# Patient Record
Sex: Female | Born: 1975 | Race: White | Hispanic: No | Marital: Married | Smoking: Never smoker
Health system: Southern US, Community
[De-identification: ages and names within clinical notes are randomized; demographics above are authoritative.]

## PROBLEM LIST (undated history)

## (undated) DIAGNOSIS — D219 Benign neoplasm of connective and other soft tissue, unspecified: Secondary | ICD-10-CM

## (undated) DIAGNOSIS — F32A Depression, unspecified: Secondary | ICD-10-CM

## (undated) DIAGNOSIS — F419 Anxiety disorder, unspecified: Secondary | ICD-10-CM

## (undated) HISTORY — DX: Benign neoplasm of connective and other soft tissue, unspecified: D21.9

## (undated) HISTORY — PX: FOOT SURGERY: SHX648

---

## 2009-12-25 ENCOUNTER — Other Ambulatory Visit
Admission: RE | Admit: 2009-12-25 | Discharge: 2009-12-25 | Payer: Self-pay | Source: Home / Self Care | Admitting: Family Medicine

## 2013-01-29 ENCOUNTER — Other Ambulatory Visit: Payer: Self-pay

## 2013-01-29 ENCOUNTER — Other Ambulatory Visit (HOSPITAL_COMMUNITY)
Admission: RE | Admit: 2013-01-29 | Discharge: 2013-01-29 | Disposition: A | Discharge status: Home / Self Care | Payer: 59 | Source: Ambulatory Visit | Attending: Family Medicine | Admitting: Family Medicine

## 2013-01-29 DIAGNOSIS — Z01419 Encounter for gynecological examination (general) (routine) without abnormal findings: Secondary | ICD-10-CM | POA: Insufficient documentation

## 2014-12-18 ENCOUNTER — Ambulatory Visit (INDEPENDENT_AMBULATORY_CARE_PROVIDER_SITE_OTHER): Payer: 59 | Admitting: Sports Medicine

## 2014-12-18 ENCOUNTER — Encounter: Payer: Self-pay | Admitting: Sports Medicine

## 2014-12-18 VITALS — BP 130/82 | Ht 63.0 in | Wt 160.0 lb

## 2014-12-18 DIAGNOSIS — M25579 Pain in unspecified ankle and joints of unspecified foot: Secondary | ICD-10-CM

## 2014-12-18 NOTE — Progress Notes (Signed)
   Subjective:    Patient ID: Mckenzie Maldonado, female    DOB: 1975-07-17, 39 y.o.   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 Pain is primarily along the medial aspect of her ankle and into her arch. She's also desribing some pain along lateral aspect of her right foot. She has had custom orthotics made by a podiatrist in the past which were somewhat comfortable. She has most recently been working with a Curator and he has been doing some soft tissue release and at, his recommendation, she discontinued her orthotics altogether. She describes a generalized aching discomfort that involves the majority of the plantar aspect of her foot with running. This is in addition to the medial sided ankle pain which she has had for years. Despite her pain she has been able to complete 9 half marathons. No trauma. She denies swelling. No numbness or tingling. She'll take an occasional over-the-counter NSAID as needed for pain. No prior surgery to either foot or ankle in the past.  Past medical history reviewed Medications reviewed Allergies reviewed    Review of Systems    as above Objective:   Physical Exam Well-developed, well-nourished. No acute distress. Awake alert and oriented 3. Vital signs reviewed  Examination of each foot shows mild pes planus with standing. She has collapse of the transverse arches bilaterally. No significant callus buildup. Slight tenderness to palpation along the posterior tibialis tendon bilaterally but not marked. No tenderness to palpation or percussion along the medial malleolus. Normal calcaneal inversion with standing on her tiptoes. Neurovascularly intact distally.  Bilateral hips: Tight IT bands bilaterally.  Examination of her running gait shows out toeing bilaterally with mild pes planus. She runs without a limp.       Assessment & Plan:    Chronic bilateral posterior tibialis tendinopathy Pes planus Transverse arch collapse  I will start with giving the patient some IT band stretches, piriformis stretches, and hip adductor stretches. She is also going to start a modified Alfredson heel drop exercise with her toe pointed inwards to isolate the posterior tibialis tendon. I think she definitely needs an orthotic but the orthotics made by the podiatrist are very rigid. I would like to start with a temporary green sports insole to which we will add scaphoid pads and metatarsal pads. She'll follow-up in the office in 6-8 weeks for reevaluation. Depending on how she is doing we may consider new custom orthotics at that time. She is okay to continue with all activity including running using pain as her guide.

## 2015-02-06 ENCOUNTER — Encounter: Payer: Self-pay | Admitting: Sports Medicine

## 2015-02-06 ENCOUNTER — Ambulatory Visit (INDEPENDENT_AMBULATORY_CARE_PROVIDER_SITE_OTHER): Payer: 59 | Admitting: Sports Medicine

## 2015-02-06 VITALS — BP 114/76 | Ht 63.0 in | Wt 155.0 lb

## 2015-02-06 DIAGNOSIS — M629 Disorder of muscle, unspecified: Secondary | ICD-10-CM

## 2015-02-06 DIAGNOSIS — M76829 Posterior tibial tendinitis, unspecified leg: Secondary | ICD-10-CM

## 2015-02-06 NOTE — Progress Notes (Signed)
   Subjective:    Patient ID: Mckenzie Maldonado, female    DOB: [birth year], 40 y.o.   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 She tried green inserts with scaphoid padding but felt that the arch support was too much. Her pain is slightly improved despite not really doing exercises at this time. She has done a little bit of IT band stretches as well as posterior tibialis eccentrics.  Past medical history reviewed Medications reviewed Allergies reviewed    Review of Systems    as above Objective:   Physical Exam Well-developed, well-nourished. No acute distress. Awake alert and oriented 3. Vital signs reviewed  Examination of each foot shows mild pes planus with standing. She has collapse of the transverse arches bilaterally. No significant callus buildup. Slight tenderness to palpation along the posterior tibialis tendon bilaterally but not marked. No tenderness to palpation or percussion along the medial malleolus. Normal calcaneal inversion with standing on her tiptoes. Neurovascularly intact distally.  Examination of her running gait shows out toeing bilaterally with mild pes planus. She runs without a limp.       Assessment & Plan:  Chronic bilateral posterior tibialis tendinopathy Pes planus Transverse arch collapse  Continue with stretches and PT exercises Orthotics made today F/U in 4 weeks to re-evaluate   Patient was fitted for a standard, cushioned, semi-rigid orthotic. The orthotic was heated and afterward the patient stood on the orthotic blank positioned on the orthotic stand. The patient was positioned in subtalar neutral position and 10 degrees of ankle dorsiflexion in a weight bearing stance. After completion of molding, a stable base was applied to the orthotic blank. The blank was ground to a stable position for weight bearing. Size:  9 Base: Blue EVA Additional Posting and Padding: None The patient ambulated these, and they were very comfortable.  I spent 40 minutes with this patient, greater than 50% was face-to-face time counseling regarding the below diagnosis.

## 2016-07-20 ENCOUNTER — Other Ambulatory Visit: Payer: Self-pay | Admitting: Family Medicine

## 2016-07-20 ENCOUNTER — Other Ambulatory Visit (HOSPITAL_COMMUNITY)
Admission: RE | Admit: 2016-07-20 | Discharge: 2016-07-20 | Disposition: A | Discharge status: Home / Self Care | Payer: 59 | Source: Ambulatory Visit | Attending: Family Medicine | Admitting: Family Medicine

## 2016-07-20 DIAGNOSIS — Z01411 Encounter for gynecological examination (general) (routine) with abnormal findings: Secondary | ICD-10-CM | POA: Diagnosis present

## 2016-07-20 DIAGNOSIS — Z1151 Encounter for screening for human papillomavirus (HPV): Secondary | ICD-10-CM | POA: Insufficient documentation

## 2016-07-22 LAB — CYTOLOGY - PAP
ADEQUACY: ABSENT
Diagnosis: NEGATIVE
HPV: NOT DETECTED

## 2016-12-16 ENCOUNTER — Ambulatory Visit: Payer: 59 | Admitting: Family Medicine

## 2016-12-16 ENCOUNTER — Encounter: Payer: Self-pay | Admitting: Family Medicine

## 2016-12-16 DIAGNOSIS — M25561 Pain in right knee: Secondary | ICD-10-CM

## 2016-12-16 NOTE — Patient Instructions (Signed)
Your exam is reassuring. This is consistent with distal IT band syndrome (your meniscus testing is negative, testing for stress fracture negative, and there's no evidence of a plica laterally). I would run on level ground or treadmill at most 10 minutes at a time every other day for at least 2 weeks, up to 4 weeks - stop if pain is worse than a 3 on a scale of 1-10 or you're limping. Cross train on off days with cycling, swimming, or (if not painful) elliptical. Focus on IT band stretches and hip, knee strengthening. 3 sets of 10 of hip abduction, standing hip rotation, knee extensions, and hamstring curls. Add ankle weight if these become too easy. Icing 15 minutes at a time after exercise. If at any point the pain is severe and it's during office hours, swing by and I'll quickly reexamine you (no charge visit). Follow up with me as needed otherwise if you're doing well.

## 2016-12-20 ENCOUNTER — Encounter: Payer: Self-pay | Admitting: Family Medicine

## 2016-12-20 DIAGNOSIS — M25561 Pain in right knee: Secondary | ICD-10-CM | POA: Insufficient documentation

## 2016-12-20 NOTE — Progress Notes (Signed)
PCP: London Pepper, MD  Subjective:   HPI: Patient is a 41 y.o. female here for right knee pain.  Patient reports she started to get pain lateral right knee on 10/15 while training for a marathon (ran the marathon on 11/4). Pain is 1/10 level but gets up to 8/10 at times with running, sharp. Pain started about 1 mile into her training run and nothing unusual about that run. She saw chiropractor and started doing short runs, relative rest leading up to marathon - only able to run 3 miles before pain worsened. She rested 2 weeks after this then in a short run pain started right away. Has been icing, not taking any medicines. Cross training. Able to do walk:jog this week ok. Hip feels tight and achy since this. No prior knee issues. No skin changes, numbness.  History reviewed. No pertinent past medical history.  Current Outpatient Medications on File Prior to Visit  Medication Sig Dispense Refill  . ibuprofen (ADVIL,MOTRIN) 200 MG tablet Take 200 mg by mouth every 6 (six) hours as needed.    [name] FE 1/20 1-20 MG-MCG tablet Take 1 tablet by mouth daily.  3   No current facility-administered medications on file prior to visit.     History reviewed. No pertinent surgical history.  No Known Allergies  Social History   Socioeconomic History  . Marital status: Married    Spouse name: Not on file  . Number of children: Not on file  . Years of education: Not on file  . Highest education level: Not on file  Social Needs  . Financial resource strain: Not on file  . Food insecurity - worry: Not on file  . Food insecurity - inability: Not on file  . Transportation needs - medical: Not on file  . Transportation needs - non-medical: Not on file  Occupational History  . Not on file  Tobacco Use  . Smoking status: Never Smoker  . Smokeless tobacco: Never Used  Substance and Sexual Activity  . Alcohol use: Not on file  . Drug use: Not on file  . Sexual activity: Not on file   Other Topics Concern  . Not on file  Social History Narrative  . Not on file    History reviewed. No pertinent family history.  BP 134/87   Pulse 66   Ht 5\' 3"  (1.6 m)   Wt 135 lb (61.2 kg)   BMI 23.91 kg/m   Review of Systems: See HPI above.     Objective:  Physical Exam:  Gen: NAD, comfortable in exam room  Right knee: No gross deformity, ecchymoses, swelling.  No patellar maltracking. No TTP. FROM with 5/5 strength without pain including hip abduction. Negative ant/post drawers. Negative valgus/varus testing. Negative lachmanns. Negative mcmurrays, apleys, patellar apprehension. Negative hop test. NV intact distally.  Left knee: No gross deformity, ecchymoses, swelling. No TTP. FROM with 5/5 strength. Negative ant/post drawers. Negative valgus/varus testing. Negative lachmanns. NV intact distally.   Patient ran on treadmill for 20 minutes, couldn't reproduce pain during visit.  Assessment & Plan:  1. Right knee pain - exam reassuring and unable to reproduce today after running on treadmill.  Consistent with distal IT band syndrome.  Shown home exercises and stretches to do daily.  Discussed walk:jog program on level ground with cross training on off days.  Icing if needed, tylenol or ibuprofen if needed.  Advised to follow up if pain becomes severe during day for reevaluation. F/u prn otherwise.

## 2016-12-20 NOTE — Assessment & Plan Note (Signed)
exam reassuring and unable to reproduce today after running on treadmill.  Consistent with distal IT band syndrome.  Shown home exercises and stretches to do daily.  Discussed walk:jog program on level ground with cross training on off days.  Icing if needed, tylenol or ibuprofen if needed.  Advised to follow up if pain becomes severe during day for reevaluation. F/u prn otherwise.

## 2018-02-09 ENCOUNTER — Encounter: Payer: Self-pay | Admitting: Obstetrics and Gynecology

## 2018-02-20 ENCOUNTER — Other Ambulatory Visit: Payer: Self-pay

## 2018-02-20 ENCOUNTER — Encounter: Payer: Self-pay | Admitting: Obstetrics and Gynecology

## 2018-02-20 ENCOUNTER — Ambulatory Visit (INDEPENDENT_AMBULATORY_CARE_PROVIDER_SITE_OTHER): Payer: BLUE CROSS/BLUE SHIELD | Admitting: Obstetrics and Gynecology

## 2018-02-20 VITALS — BP 128/80 | HR 66 | Resp 14 | Ht 62.0 in | Wt 152.4 lb

## 2018-02-20 DIAGNOSIS — R350 Frequency of micturition: Secondary | ICD-10-CM | POA: Diagnosis not present

## 2018-02-20 DIAGNOSIS — D259 Leiomyoma of uterus, unspecified: Secondary | ICD-10-CM

## 2018-02-20 DIAGNOSIS — R3915 Urgency of urination: Secondary | ICD-10-CM

## 2018-02-20 DIAGNOSIS — R109 Unspecified abdominal pain: Secondary | ICD-10-CM

## 2018-02-20 DIAGNOSIS — Z8049 Family history of malignant neoplasm of other genital organs: Secondary | ICD-10-CM

## 2018-02-20 DIAGNOSIS — Z01419 Encounter for gynecological examination (general) (routine) without abnormal findings: Secondary | ICD-10-CM | POA: Diagnosis not present

## 2018-02-20 DIAGNOSIS — N939 Abnormal uterine and vaginal bleeding, unspecified: Secondary | ICD-10-CM

## 2018-02-20 DIAGNOSIS — Z124 Encounter for screening for malignant neoplasm of cervix: Secondary | ICD-10-CM

## 2018-02-20 DIAGNOSIS — N921 Excessive and frequent menstruation with irregular cycle: Secondary | ICD-10-CM

## 2018-02-20 DIAGNOSIS — R102 Pelvic and perineal pain: Secondary | ICD-10-CM

## 2018-02-20 DIAGNOSIS — N882 Stricture and stenosis of cervix uteri: Secondary | ICD-10-CM

## 2018-02-20 LAB — POCT URINALYSIS DIPSTICK
Bilirubin, UA: NEGATIVE
Glucose, UA: NEGATIVE
Ketones, UA: NEGATIVE
LEUKOCYTES UA: NEGATIVE
NITRITE UA: NEGATIVE
Protein, UA: NEGATIVE
RBC UA: NEGATIVE
Urobilinogen, UA: 0.2 E.U./dL
pH, UA: 5 (ref 5.0–8.0)

## 2018-02-20 NOTE — Progress Notes (Signed)
43 y.o. G0P0000 Married Other or two or more races Not Hispanic or Latino female here for new patient exam.  She is on OCP's. She is having "weird cramping" throughout the month, no associated bleeding. The cramping started in the last year and has been more frequent in the last 6 months.  The pain can be anywhere in her lower abdomen, can be a pressure pain, an ache or a wave of pain. Different than her menstrual cramps. The pain lasts for a couple of minutes can come and go over a few hours. The pain can wake her up at night. The pain is a 1-2/10 in severity. The pain is occurring ~1od.  Normal BM qd. No change in the pain with what she eats.  Recently her cycles can linger on for a total of 2 weeks. One to two episodes of heavier than normal bleeding with her cycle. She is taking her pills at the same time daily. The longer cycles is occurring ~q3 months in the last year.  She has a h/o fibroids. Prior to OCP's her cycles were very heavy.  Period Cycle (Days): 28 Period Duration (Days): 5-7  Period Pattern: Regular Menstrual Flow: Moderate, Light Menstrual Control: Tampon Menstrual Control Change Freq (Hours): changes tampon every 6 hours Dysmenorrhea: (!) Mild Dysmenorrhea Symptoms: Cramping  Sexually active, just occasional dyspareunia, positional.   She has long term urinary frequency, worse in the last year. Urgency is new in the last year. No leakage.  She drinks 2 large cups of coffee a day, 2-3 soda's or tea a day as well.   Patient's last menstrual period was 01/27/2018.          Sexually active: Yes.    The current method of family planning is OCP (estrogen/progesterone).    Exercising: Yes.    cardio, strength training Smoker:  no  Health Maintenance: Pap:  07-20-16 negative, HR HPV negative           01-29-13 negative  History of abnormal Pap:  no MMG:  never BMD:   never Colonoscopy: never TDaP:  UTD per patient Gardasil: n/a   reports that she has never smoked. She has  never used smokeless tobacco. She reports current alcohol use. She reports that she does not use drugs. ETOH 1-2 a week. She works in Engineer, technical sales.   Past Medical History:  Diagnosis Date  . Fibroid     History reviewed. No pertinent surgical history.  Current Outpatient Medications  Medication Sig Dispense Refill  . BIOTIN W/ VITAMINS C & E PO Take by mouth.    Marland Kitchen ibuprofen (ADVIL,MOTRIN) 200 MG tablet Take 200 mg by mouth every 6 (six) hours as needed.    [name] FE 1/20 1-20 MG-MCG tablet Take 1 tablet by mouth daily.  3   No current facility-administered medications for this visit.     Family History  Problem Relation Age of Onset  . Uterine cancer Mother   Mom was 69 and over weight.   Review of Systems  Constitutional: Negative.   HENT: Negative.   Eyes: Negative.   Respiratory: Negative.   Cardiovascular: Negative.   Gastrointestinal: Negative.   Endocrine: Negative.   Genitourinary: Positive for frequency and urgency.       Dysmenorrhea   Musculoskeletal: Negative.   Skin: Negative.   Allergic/Immunologic: Negative.   Neurological: Negative.   Hematological: Negative.   Psychiatric/Behavioral: Negative.     Exam:   BP 128/80 (BP Location: Right Arm, Patient Position: Sitting,  Cuff Size: Normal)   Pulse 66   Resp 14   Ht 5\' 2"  (1.575 m)   Wt 152 lb 6.4 oz (69.1 kg)   LMP 01/27/2018   BMI 27.87 kg/m   Weight change: @WEIGHTCHANGE @ Height:   Height: 5\' 2"  (157.5 cm)  Ht Readings from Last 3 Encounters:  02/20/18 5\' 2"  (1.575 m)  12/16/16 5\' 3"  (1.6 m)  02/06/15 5\' 3"  (1.6 m)    General appearance: alert, cooperative and appears stated age Head: Normocephalic, without obvious abnormality, atraumatic Neck: no adenopathy, supple, symmetrical, trachea midline and thyroid normal to inspection and palpation Lungs: clear to auscultation bilaterally Cardiovascular: regular rate and rhythm Breasts: normal appearance, no masses or tenderness Abdomen: soft, non-tender;  non distended,  no masses,  no organomegaly Extremities: extremities normal, atraumatic, no cyanosis or edema Skin: Skin color, texture, turgor normal. No rashes or lesions Lymph nodes: Cervical, supraclavicular, and axillary nodes normal. No abnormal inguinal nodes palpated Neurologic: Grossly normal   Pelvic: External genitalia:  no lesions              Urethra:  normal appearing urethra with no masses, tenderness or lesions              Bartholins and Skenes: normal                 Vagina: normal appearing vagina with normal color and discharge, no lesions              Cervix: no lesions and stenotic               Bimanual Exam:  Uterus:  uterus is retroverted, slightly irregular, slightly enlarged, mobile, not tender              Adnexa: no mass, fullness, tenderness               Rectovaginal: Confirms               Anus:  normal sphincter tone, no lesions  Chaperone was present for exam.  A:  Well Woman with normal exam  Abnormal bleeding on OCP's  Fibroid uterus  Abdominal/pelvic pain  OAB symptoms, drinks large amounts of caffeine   P:   Pap with hpv  Mammogram, # given  Return for gyn Ultrasound, possible endometrial biopsy  TSH today  Urine for ua, c&s  Cut back on caffeine   Other labs with primary  Discussed breast self exam  Discussed calcium and vit D intake  Depending on U/S results will make further plans on OCP's

## 2018-02-20 NOTE — Patient Instructions (Signed)
EXERCISE AND DIET:  We recommended that you start or continue a regular exercise program for good health. Regular exercise means any activity that makes your heart beat faster and makes you sweat.  We recommend exercising at least 30 minutes per day at least 3 days a week, preferably 4 or 5.  We also recommend a diet low in fat and sugar.  Inactivity, poor dietary choices and obesity can cause diabetes, heart attack, stroke, and kidney damage, among others.    ALCOHOL AND SMOKING:  Women should limit their alcohol intake to no more than 7 drinks/beers/glasses of wine (combined, not each!) per week. Moderation of alcohol intake to this level decreases your risk of breast cancer and liver damage. And of course, no recreational drugs are part of a healthy lifestyle.  And absolutely no smoking or even second hand smoke. Most people know smoking can cause heart and lung diseases, but did you know it also contributes to weakening of your bones? Aging of your skin?  Yellowing of your teeth and nails?  CALCIUM AND VITAMIN D:  Adequate intake of calcium and Vitamin D are recommended.  The recommendations for exact amounts of these supplements seem to change often, but generally speaking 1,000 mg of calcium (between diet and supplement) and 800 units of Vitamin D per day seems prudent. Certain women may benefit from higher intake of Vitamin D.  If you are among these women, your doctor will have told you during your visit.    PAP SMEARS:  Pap smears, to check for cervical cancer or precancers,  have traditionally been done yearly, although recent scientific advances have shown that most women can have pap smears less often.  However, every woman still should have a physical exam from her gynecologist every year. It will include a breast check, inspection of the vulva and vagina to check for abnormal growths or skin changes, a visual exam of the cervix, and then an exam to evaluate the size and shape of the uterus and  ovaries.  And after 40 years of age, a rectal exam is indicated to check for rectal cancers. We will also provide age appropriate advice regarding health maintenance, like when you should have certain vaccines, screening for sexually transmitted diseases, bone density testing, colonoscopy, mammograms, etc.   MAMMOGRAMS:  All women over 40 years old should have a yearly mammogram. Many facilities now offer a "3D" mammogram, which may cost around $50 extra out of pocket. If possible,  we recommend you accept the option to have the 3D mammogram performed.  It both reduces the number of women who will be called back for extra views which then turn out to be normal, and it is better than the routine mammogram at detecting truly abnormal areas.    COLON CANCER SCREENING: Now recommend starting at age 45. At this time colonoscopy is not covered for routine screening until 50. There are take home tests that can be done between 45-49.   COLONOSCOPY:  Colonoscopy to screen for colon cancer is recommended for all women at age 50.  We know, you hate the idea of the prep.  We agree, BUT, having colon cancer and not knowing it is worse!!  Colon cancer so often starts as a polyp that can be seen and removed at colonscopy, which can quite literally save your life!  And if your first colonoscopy is normal and you have no family history of colon cancer, most women don't have to have it again for   10 years.  Once every ten years, you can do something that may end up saving your life, right?  We will be happy to help you get it scheduled when you are ready.  Be sure to check your insurance coverage so you understand how much it will cost.  It may be covered as a preventative service at no cost, but you should check your particular policy.      Breast Self-Awareness Breast self-awareness means being familiar with how your breasts look and feel. It involves checking your breasts regularly and reporting any changes to your  health care provider. Practicing breast self-awareness is important. A change in your breasts can be a sign of a serious medical problem. Being familiar with how your breasts look and feel allows you to find any problems early, when treatment is more likely to be successful. All women should practice breast self-awareness, including women who have had breast implants. How to do a breast self-exam One way to learn what is normal for your breasts and whether your breasts are changing is to do a breast self-exam. To do a breast self-exam: Look for Changes  1. Remove all the clothing above your waist. 2. Stand in front of a mirror in a room with good lighting. 3. Put your hands on your hips. 4. Push your hands firmly downward. 5. Compare your breasts in the mirror. Look for differences between them (asymmetry), such as: ? Differences in shape. ? Differences in size. ? Puckers, dips, and bumps in one breast and not the other. 6. Look at each breast for changes in your skin, such as: ? Redness. ? Scaly areas. 7. Look for changes in your nipples, such as: ? Discharge. ? Bleeding. ? Dimpling. ? Redness. ? A change in position. Feel for Changes Carefully feel your breasts for lumps and changes. It is best to do this while lying on your back on the floor and again while sitting or standing in the shower or tub with soapy water on your skin. Feel each breast in the following way:  Place the arm on the side of the breast you are examining above your head.  Feel your breast with the other hand.  Start in the nipple area and make  inch (2 cm) overlapping circles to feel your breast. Use the pads of your three middle fingers to do this. Apply light pressure, then medium pressure, then firm pressure. The light pressure will allow you to feel the tissue closest to the skin. The medium pressure will allow you to feel the tissue that is a little deeper. The firm pressure will allow you to feel the tissue  close to the ribs.  Continue the overlapping circles, moving downward over the breast until you feel your ribs below your breast.  Move one finger-width toward the center of the body. Continue to use the  inch (2 cm) overlapping circles to feel your breast as you move slowly up toward your collarbone.  Continue the up and down exam using all three pressures until you reach your armpit.  Write Down What You Find  Write down what is normal for each breast and any changes that you find. Keep a written record with breast changes or normal findings for each breast. By writing this information down, you do not need to depend only on memory for size, tenderness, or location. Write down where you are in your menstrual cycle, if you are still menstruating. If you are having trouble noticing differences   in your breasts, do not get discouraged. With time you will become more familiar with the variations in your breasts and more comfortable with the exam. How often should I examine my breasts? Examine your breasts every month. If you are breastfeeding, the best time to examine your breasts is after a feeding or after using a breast pump. If you menstruate, the best time to examine your breasts is 5-7 days after your period is over. During your period, your breasts are lumpier, and it may be more difficult to notice changes. When should I see my health care provider? See your health care provider if you notice:  A change in shape or size of your breasts or nipples.  A change in the skin of your breast or nipples, such as a reddened or scaly area.  Unusual discharge from your nipples.  A lump or thick area that was not there before.  Pain in your breasts.  Anything that concerns you.    Uterine Fibroids  Uterine fibroids (leiomyomas) are noncancerous (benign) tumors that can develop in the uterus. Fibroids may also develop in the fallopian tubes, cervix, or tissues (ligaments) near the  uterus. You may have one or many fibroids. Fibroids vary in size, weight, and where they grow in the uterus. Some can become quite large. Most fibroids do not require medical treatment. What are the causes? The cause of this condition is not known. What increases the risk? You are more likely to develop this condition if you:  Are in your 30s or 40s and have not gone through menopause.  Have a family history of this condition.  Are of African-American descent.  Had your first period at an early age (early menarche).  Have not had any children (nulliparity).  Are overweight or obese. What are the signs or symptoms? Many women do not have any symptoms. Symptoms of this condition may include:  Heavy menstrual bleeding.  Bleeding or spotting between periods.  Pain and pressure in the pelvic area, between the hips.  Bladder problems, such as needing to urinate urgently or more often than usual.  Inability to have children (infertility).  Failure to carry pregnancy to term (miscarriage). How is this diagnosed? This condition may be diagnosed based on:  Your symptoms and medical history.  A physical exam.  A pelvic exam that includes feeling for any tumors.  Imaging tests, such as ultrasound or MRI. How is this treated? Treatment for this condition may include:  Seeing your health care provider for follow-up visits to monitor your fibroids for any changes.  Taking NSAIDs such as ibuprofen, naproxen, or aspirin to reduce pain.  Hormone medicines. These may be taken as a pill, given in an injection, or delivered by a T-shaped device that is inserted into the uterus (intrauterine device, IUD).  Surgery to remove one of the following: ? The fibroids (myomectomy). Your health care provider may recommend this if fibroids affect your fertility and you want to become pregnant. ? The uterus (hysterectomy). ? Blood supply to the fibroids (uterine artery embolization). Follow  these instructions at home:  Take over-the-counter and prescription medicines only as told by your health care provider.  Ask your health care provider if you should take iron pills or eat more iron-rich foods, such as dark green, leafy vegetables. Heavy menstrual bleeding can cause low iron levels.  If directed, apply heat to your back or abdomen to reduce pain. Use the heat source that your health care provider recommends, such as  a moist heat pack or a heating pad. ? Place a towel between your skin and the heat source. ? Leave the heat on for 20-30 minutes. ? Remove the heat if your skin turns bright red. This is especially important if you are unable to feel pain, heat, or cold. You may have a greater risk of getting burned.  Pay close attention to your menstrual cycle. Tell your health care provider about any changes, such as: ? Increased blood flow that requires you to use more pads or tampons than usual. ? A change in the number of days that your period lasts. ? A change in symptoms that are associated with your period, such as back pain or cramps in your abdomen.  Keep all follow-up visits as told by your health care provider. This is important, especially if your fibroids need to be monitored for any changes. Contact a health care provider if you:  Have pelvic pain, back pain, or cramps in your abdomen that do not get better with medicine or heat.  Develop new bleeding between periods.  Have increased bleeding during or between periods.  Feel unusually tired or weak.  Feel light-headed. Get help right away if you:  Faint.  Have pelvic pain that suddenly gets worse.  Have severe vaginal bleeding that soaks a tampon or pad in 30 minutes or less. Summary  Uterine fibroids are noncancerous (benign) tumors that can develop in the uterus.  The exact cause of this condition is not known.  Most fibroids do not require medical treatment unless they affect your ability to have  children (fertility).  Contact a health care provider if you have pelvic pain, back pain, or cramps in your abdomen that do not get better with medicines.  Make sure you know what symptoms should cause you to get help right away. This information is not intended to replace advice given to you by your health care provider. Make sure you discuss any questions you have with your health care provider. Document Released: 12/26/1999 Document Revised: 11/23/2016 Document Reviewed: 11/23/2016 Elsevier Interactive Patient Education  2019 [employer].

## 2018-02-21 ENCOUNTER — Other Ambulatory Visit (HOSPITAL_COMMUNITY)
Admission: RE | Admit: 2018-02-21 | Discharge: 2018-02-21 | Disposition: A | Discharge status: Home / Self Care | Payer: BLUE CROSS/BLUE SHIELD | Source: Ambulatory Visit | Attending: Obstetrics and Gynecology | Admitting: Obstetrics and Gynecology

## 2018-02-21 DIAGNOSIS — Z124 Encounter for screening for malignant neoplasm of cervix: Secondary | ICD-10-CM | POA: Diagnosis not present

## 2018-02-21 LAB — URINALYSIS, MICROSCOPIC ONLY
Bacteria, UA: NONE SEEN
Casts: NONE SEEN /lpf

## 2018-02-21 LAB — URINE CULTURE: Organism ID, Bacteria: NO GROWTH

## 2018-02-21 LAB — TSH: TSH: 3.22 u[IU]/mL (ref 0.450–4.500)

## 2018-02-21 NOTE — Addendum Note (Signed)
Addended by: Dorothy Spark on: 02/21/2018 04:58 PM   Modules accepted: Orders

## 2018-02-22 ENCOUNTER — Telehealth: Payer: Self-pay | Admitting: Obstetrics and Gynecology

## 2018-02-22 NOTE — Telephone Encounter (Signed)
Call placed to patient to review benefits and schedule recommended ultrasound. Left voicemail message requesting a return call °

## 2018-02-22 NOTE — Telephone Encounter (Signed)
Patient returned call. Spoke with patient regarding benefit for an ultrasound. Patient understood and agreeable. Patient ready to schedule. Patient scheduled 02/28/2018 with Dr Talbert Nan. Patient aware of appointment date, arrival time and cancellation policy. No further questions. Ok to close

## 2018-02-23 ENCOUNTER — Telehealth: Payer: Self-pay | Admitting: Obstetrics and Gynecology

## 2018-02-23 LAB — CYTOLOGY - PAP
Diagnosis: NEGATIVE
HPV: NOT DETECTED

## 2018-02-23 NOTE — Telephone Encounter (Signed)
Call to patient.  Advised okay to be on cycle for ultrasound. But can reschedule for her comfort.  Pt declines, states she is okay with keeping appointment as scheduled.  No changes made. Encounter closed.

## 2018-02-23 NOTE — Telephone Encounter (Signed)
Patient has ultrasound 02/28/18 and states she will be on her cycle. Wants to know if this is OK.

## 2018-02-28 ENCOUNTER — Encounter: Payer: Self-pay | Admitting: Obstetrics and Gynecology

## 2018-02-28 ENCOUNTER — Other Ambulatory Visit: Payer: Self-pay

## 2018-02-28 ENCOUNTER — Ambulatory Visit (INDEPENDENT_AMBULATORY_CARE_PROVIDER_SITE_OTHER): Payer: BLUE CROSS/BLUE SHIELD

## 2018-02-28 ENCOUNTER — Ambulatory Visit (INDEPENDENT_AMBULATORY_CARE_PROVIDER_SITE_OTHER): Payer: BLUE CROSS/BLUE SHIELD | Admitting: Obstetrics and Gynecology

## 2018-02-28 VITALS — BP 140/88 | HR 60 | Wt 152.0 lb

## 2018-02-28 DIAGNOSIS — N921 Excessive and frequent menstruation with irregular cycle: Secondary | ICD-10-CM

## 2018-02-28 DIAGNOSIS — N83202 Unspecified ovarian cyst, left side: Secondary | ICD-10-CM | POA: Diagnosis not present

## 2018-02-28 DIAGNOSIS — D259 Leiomyoma of uterus, unspecified: Secondary | ICD-10-CM | POA: Diagnosis not present

## 2018-02-28 DIAGNOSIS — N939 Abnormal uterine and vaginal bleeding, unspecified: Secondary | ICD-10-CM

## 2018-02-28 DIAGNOSIS — R109 Unspecified abdominal pain: Secondary | ICD-10-CM | POA: Diagnosis not present

## 2018-02-28 DIAGNOSIS — R102 Pelvic and perineal pain: Secondary | ICD-10-CM

## 2018-02-28 MED ORDER — NORETHIN ACE-ETH ESTRAD-FE 1-20 MG-MCG(24) PO TABS: 1.0000 | ORAL_TABLET | Freq: Every day | ORAL | 0 refills | Status: DC

## 2018-02-28 NOTE — Progress Notes (Signed)
GYNECOLOGY  VISIT   HPI: 43 y.o.   Married Other or two or more races Not Hispanic or Latino  female   [id number] with Patient's last menstrual period was 02/23/2018 (exact date).   here for consult following PUS.  The ultrasound was done for abdominal pelvic pain over the last year, worse in the last 6 months, not cycle related. She has a known fibroid uterus. Occasional deep dyspareunia. Normal bowel movements.  She is on OCP's and c/o some breakthrough bleeding, occasionally forgets to take her pill in the evening and then takes it in the am.   GYNECOLOGIC HISTORY: Patient's last menstrual period was 02/23/2018 (exact date). Contraception:OCP  Menopausal hormone therapy: None        OB History    Gravida  0   Para  0   Term  0   Preterm  0   AB  0   Living  0     SAB  0   TAB  0   Ectopic  0   Multiple  0   Live Births  0              Patient Active Problem List   Diagnosis Date Noted  . Family history of uterine cancer 02/20/2018  . Right knee pain 12/20/2016    Past Medical History:  Diagnosis Date  . Fibroid     History reviewed. No pertinent surgical history.  Current Outpatient Medications  Medication Sig Dispense Refill  . BIOTIN W/ VITAMINS C & E PO Take by mouth.    Marland Kitchen ibuprofen (ADVIL,MOTRIN) 200 MG tablet Take 200 mg by mouth every 6 (six) hours as needed.    [name] FE 1/20 1-20 MG-MCG tablet Take 1 tablet by mouth daily.  3   No current facility-administered medications for this visit.      ALLERGIES: Patient has no known allergies.  Family History  Problem Relation Age of Onset  . Uterine cancer Mother     Social History   Socioeconomic History  . Marital status: Married    Spouse name: Not on file  . Number of children: Not on file  . Years of education: Not on file  . Highest education level: Not on file  Occupational History  . Not on file  Social Needs  . Financial resource strain: Not on file  . Food insecurity:     Worry: Not on file    Inability: Not on file  . Transportation needs:    Medical: Not on file    Non-medical: Not on file  Tobacco Use  . Smoking status: Never Smoker  . Smokeless tobacco: Never Used  Substance and Sexual Activity  . Alcohol use: Yes    Alcohol/week: 0.0 standard drinks    Comment: 1-2/week  . Drug use: Never  . Sexual activity: Yes    Birth control/protection: Pill  Lifestyle  . Physical activity:    Days per week: Not on file    Minutes per session: Not on file  . Stress: Not on file  Relationships  . Social connections:    Talks on phone: Not on file    Gets together: Not on file    Attends religious service: Not on file    Active member of club or organization: Not on file    Attends meetings of clubs or organizations: Not on file    Relationship status: Not on file  . Intimate partner violence:    Fear of current  or ex partner: Not on file    Emotionally abused: Not on file    Physically abused: Not on file    Forced sexual activity: Not on file  Other Topics Concern  . Not on file  Social History Narrative  . Not on file    Review of Systems  Constitutional: Negative.   HENT: Negative.   Eyes: Negative.   Respiratory: Negative.   Cardiovascular: Negative.   Gastrointestinal: Negative.   Genitourinary: Negative.   Musculoskeletal: Negative.   Skin: Negative.   Neurological: Negative.   Endo/Heme/Allergies: Negative.   Psychiatric/Behavioral: Negative.     PHYSICAL EXAMINATION:    BP 140/88 (BP Location: Right Arm, Patient Position: Sitting, Cuff Size: Normal)   Pulse 60   Wt 152 lb (68.9 kg)   LMP 02/23/2018 (Exact Date)   BMI 27.80 kg/m     General appearance: alert, cooperative and appears stated age  Ultrasound images reviewed with the patient: retroverted uterus with many small myomas, uniform endometrium. Simple left ovarian cyst  ASSESSMENT Intermittent abdominal/pelvic pain, not tender on pelvic exam Intermittent deep  dyspareunia.  Fibroid uterus Left ovarian cyst, 3 cm. Suspect breakthrough ovulation. Some late pills.  Slightly elevated BP today, normal at last visit (she was worried)    PLAN Calendar pain, cycle, diet, BM Change to loestin 24, if too expensive will change to loestrin1.5/30 (only take 4 days of placebo) Will take her pills at the same time daily F/U in 3 months Consider f/u ultrasound, depending on her symptoms and exam Will have her return in a few weeks for a repeat BP  F/u with primary to evaluate for other possible sources of pain, I'm not convinced her fibroids are causing her pain    An After Visit Summary was printed and given to the patient.  Over 15 minutes face to face time of which over 50% was spent in counseling.

## 2018-02-28 NOTE — Patient Instructions (Signed)

## 2018-03-15 ENCOUNTER — Ambulatory Visit (INDEPENDENT_AMBULATORY_CARE_PROVIDER_SITE_OTHER): Payer: BLUE CROSS/BLUE SHIELD

## 2018-03-15 VITALS — BP 122/66 | HR 68 | Resp 16 | Ht 62.0 in | Wt 149.8 lb

## 2018-03-15 DIAGNOSIS — Z0130 Encounter for examination of blood pressure without abnormal findings: Secondary | ICD-10-CM | POA: Diagnosis not present

## 2018-03-15 NOTE — Progress Notes (Signed)
Patient here for Blood Pressure check.  Patient states that she is feeling well.   Routing to provider for final review.

## 2018-04-17 ENCOUNTER — Other Ambulatory Visit: Payer: Self-pay

## 2018-04-17 NOTE — Telephone Encounter (Signed)
Medication refill request: OCP Last AEX:  02/20/18 JJ Next AEX: 05/31/18 Last MMG (if hormonal medication request): none Refill authorized: Please advise, prescription needs to be sent to [facility] mail order if authorized

## 2018-04-18 MED ORDER — NORETHIN ACE-ETH ESTRAD-FE 1-20 MG-MCG(24) PO TABS: 1.0000 | ORAL_TABLET | Freq: Every day | ORAL | 0 refills | Status: DC

## 2018-04-18 NOTE — Telephone Encounter (Signed)
Please let the patient know that 3 months of pills has been sent to her pharmacy. Once the covid 19 crisis is over she should schedule a mammogram. Please give her the # for the breast center.

## 2018-04-18 NOTE — Telephone Encounter (Signed)
Patient notified & agrees to have mmg done once everything calms down.

## 2018-05-29 ENCOUNTER — Telehealth: Payer: Self-pay | Admitting: Obstetrics and Gynecology

## 2018-05-29 NOTE — Telephone Encounter (Signed)
Patient cancelled her 3 month recheck and will call back to reschedule.

## 2018-05-31 ENCOUNTER — Ambulatory Visit: Payer: BLUE CROSS/BLUE SHIELD | Admitting: Obstetrics and Gynecology

## 2018-06-26 ENCOUNTER — Other Ambulatory Visit: Payer: Self-pay | Admitting: Obstetrics and Gynecology

## 2018-06-26 NOTE — Telephone Encounter (Signed)
Call to patient. Patient states that she feels like the bleeding has stayed the same on this new pill. States her LMP started 6-4 and she is still on it today. States they are lighter, but lasting longer. States the bleeding is "light to medium in the middle and panty liner light on both sides of that." Denies symptoms of anemia. States she has not scheduled her MMG yet due to Covid crisis, but plans to do so. RN advised would review with Dr. Talbert Nan and return call with recommendations. Patient agreeable.

## 2018-06-26 NOTE — Telephone Encounter (Signed)
Please call and check on how she is doing on this pill. She was switched from loestrin 1/20 secondary to breakthrough bleeding.  If she is doing well, can refill it until her annual exam, #3 backs, 2 refills. She does need a mammogram, looks like a message was already left reminding her of this.

## 2018-06-26 NOTE — Telephone Encounter (Signed)
Medication refill request: Loestrin 24 FE Last AEX:  02-20-2018 JJ  Next AEX: not currently scheduled  Last MMG (if hormonal medication request): detailed message left per [facility] for patient to call and schedule MMG if not already scheduled  Refill authorized: Please advise  Message left per [facility] for patient to call and schedule MMG if has not already done so. Medication pended for #28, 0RF. Please refill if appropriate.

## 2018-06-27 NOTE — Telephone Encounter (Signed)
Left message to call Kaitlyn at [phone]. 

## 2018-06-27 NOTE — Telephone Encounter (Signed)
I'm going to change her to the loestrin 1.5/30 x 3 months. Please have her calendar all bleeding and schedule her for a f/u visit in 3 months. She should take the pills at the same time daily.  If she continues to have abnormal bleeding, I would recommend further evaluation.

## 2018-06-27 NOTE — Telephone Encounter (Signed)
Spoke with patient. Patient verbalizes understanding. 3 month recheck scheduled for 10/02/2018 at 4 pm with Dr.Jertson. Patient is agreeable to date and time.

## 2018-08-14 DIAGNOSIS — Z0000 Encounter for general adult medical examination without abnormal findings: Secondary | ICD-10-CM | POA: Diagnosis not present

## 2018-08-28 DIAGNOSIS — E785 Hyperlipidemia, unspecified: Secondary | ICD-10-CM | POA: Diagnosis not present

## 2018-08-28 DIAGNOSIS — R1030 Lower abdominal pain, unspecified: Secondary | ICD-10-CM | POA: Diagnosis not present

## 2018-09-04 ENCOUNTER — Other Ambulatory Visit: Payer: Self-pay | Admitting: Family Medicine

## 2018-09-04 DIAGNOSIS — R109 Unspecified abdominal pain: Secondary | ICD-10-CM

## 2018-09-20 ENCOUNTER — Ambulatory Visit
Admission: RE | Admit: 2018-09-20 | Discharge: 2018-09-20 | Disposition: A | Discharge status: Home / Self Care | Payer: BC Managed Care – PPO | Source: Ambulatory Visit | Attending: Family Medicine | Admitting: Family Medicine

## 2018-09-20 DIAGNOSIS — D259 Leiomyoma of uterus, unspecified: Secondary | ICD-10-CM | POA: Diagnosis not present

## 2018-09-20 DIAGNOSIS — R109 Unspecified abdominal pain: Secondary | ICD-10-CM

## 2018-09-20 MED ORDER — IOPAMIDOL (ISOVUE-300) INJECTION 61%
100.0000 mL | Freq: Once | INTRAVENOUS | Status: AC | PRN
  Administered 2018-09-20: 100 mL via INTRAVENOUS

## 2018-09-28 ENCOUNTER — Other Ambulatory Visit: Payer: Self-pay

## 2018-10-02 ENCOUNTER — Ambulatory Visit (INDEPENDENT_AMBULATORY_CARE_PROVIDER_SITE_OTHER): Payer: BC Managed Care – PPO | Admitting: Obstetrics and Gynecology

## 2018-10-02 ENCOUNTER — Other Ambulatory Visit: Payer: Self-pay

## 2018-10-02 ENCOUNTER — Encounter: Payer: Self-pay | Admitting: Obstetrics and Gynecology

## 2018-10-02 VITALS — BP 118/72 | HR 60 | Temp 97.4°F | Wt 151.6 lb

## 2018-10-02 DIAGNOSIS — Z3041 Encounter for surveillance of contraceptive pills: Secondary | ICD-10-CM | POA: Diagnosis not present

## 2018-10-02 DIAGNOSIS — D259 Leiomyoma of uterus, unspecified: Secondary | ICD-10-CM

## 2018-10-02 DIAGNOSIS — R102 Pelvic and perineal pain: Secondary | ICD-10-CM | POA: Diagnosis not present

## 2018-10-02 DIAGNOSIS — R109 Unspecified abdominal pain: Secondary | ICD-10-CM | POA: Diagnosis not present

## 2018-10-02 MED ORDER — NORETHINDRONE ACET-ETHINYL EST 1.5-30 MG-MCG PO TABS: 1.0000 | ORAL_TABLET | Freq: Every day | ORAL | 1 refills | Status: DC

## 2018-10-02 NOTE — Progress Notes (Signed)
GYNECOLOGY  VISIT   HPI: 43 y.o.   Married White or Caucasian Not Hispanic or Latino  female   G0P0000 with Patient's last menstrual period was 08/31/2018 (approximate).   here for 3 month follow up on OCP. Patient reports she is doing well with OCP. Denies any break through bleeding. No concerns. She was evaluated for abdominal pelvic pain in 2/20, negative ultrasound other than fibroids. She just had a CT, no specific abdominal findings. Incidental finding of a nodule in her lung.  Currently her pain is less frequent, very mild. Last episode of moderate pain was ~2 months ago. That pain was in her pelvis and came in waves, felt different than menstrual cramps. The mild pain is random, not related to her cycle, she can have the pain every 1-7 days. All feels low in her pelvis, the pain is tolerable. BM normal daily to every other day. Normal voiding. Occasional deep, positional dyspareunia.  Cycles q month x 4 days. Saturates a regular tampon in 4 hours. Non to mild cramps. No intermenstrual bleeding.    GYNECOLOGIC HISTORY: Patient's last menstrual period was 08/31/2018 (approximate). Contraception: OCP Menopausal hormone therapy: None        OB History    Gravida  0   Para  0   Term  0   Preterm  0   AB  0   Living  0     SAB  0   TAB  0   Ectopic  0   Multiple  0   Live Births  0              Patient Active Problem List   Diagnosis Date Noted  . Family history of uterine cancer 02/20/2018  . Right knee pain 12/20/2016    Past Medical History:  Diagnosis Date  . Fibroid     History reviewed. No pertinent surgical history.  Current Outpatient Medications  Medication Sig Dispense Refill  . ibuprofen (ADVIL,MOTRIN) 200 MG tablet Take 200 mg by mouth every 6 (six) hours as needed.    . Norethindrone Acetate-Ethinyl Estradiol (LOESTRIN 1.5/30, 21,) 1.5-30 MG-MCG tablet Take 1 tablet by mouth daily. 3 Package 0   No current facility-administered medications  for this visit.      ALLERGIES: Patient has no known allergies.  Family History  Problem Relation Age of Onset  . Uterine cancer Mother     Social History   Socioeconomic History  . Marital status: Married    Spouse name: Not on file  . Number of children: Not on file  . Years of education: Not on file  . Highest education level: Not on file  Occupational History  . Not on file  Social Needs  . Financial resource strain: Not on file  . Food insecurity    Worry: Not on file    Inability: Not on file  . Transportation needs    Medical: Not on file    Non-medical: Not on file  Tobacco Use  . Smoking status: Never Smoker  . Smokeless tobacco: Never Used  Substance and Sexual Activity  . Alcohol use: Yes    Alcohol/week: 0.0 standard drinks    Comment: 1-2/week  . Drug use: Never  . Sexual activity: Yes    Birth control/protection: Pill  Lifestyle  . Physical activity    Days per week: Not on file    Minutes per session: Not on file  . Stress: Not on file  Relationships  . Social  connections    Talks on phone: Not on file    Gets together: Not on file    Attends religious service: Not on file    Active member of club or organization: Not on file    Attends meetings of clubs or organizations: Not on file    Relationship status: Not on file  . Intimate partner violence    Fear of current or ex partner: Not on file    Emotionally abused: Not on file    Physically abused: Not on file    Forced sexual activity: Not on file  Other Topics Concern  . Not on file  Social History Narrative  . Not on file    Review of Systems  Constitutional: Negative.   HENT: Negative.   Eyes: Negative.   Respiratory: Negative.   Cardiovascular: Negative.   Gastrointestinal: Negative.   Genitourinary: Negative.   Musculoskeletal: Negative.   Skin: Negative.   Neurological: Negative.   Endo/Heme/Allergies: Negative.   Psychiatric/Behavioral: Negative.     PHYSICAL  EXAMINATION:    BP 118/72 (BP Location: Right Arm, Patient Position: Sitting, Cuff Size: Normal)   Pulse 60   Temp (!) 97.4 F (36.3 C) (Skin)   Wt 151 lb 9.6 oz (68.8 kg)   LMP 08/31/2018 (Approximate)   BMI 27.73 kg/m     General appearance: alert, cooperative and appears stated age Abdomen: soft, non-tender; non distended, no masses,  no organomegaly  Reviewed ultrasound images and CT with the patient  ASSESSMENT Contraception surveillance, doing well on this pill H/o abdominal/pelvic pain. Pain is still present but less frequent, mild and tolerable. Not previously tender on pelvic exam, normal abdominal exam today. Fibroid uterus on ultrasound and CT. No concerning findings on recent CT. Prior ovarian cyst resolved on CT Normal BP (one previously elevated level earlier this year)    PLAN Continue OCP's F/U in 2/21 for an annual exam Call with any concerns   An After Visit Summary was printed and given to the patient.  ~15 minutes face to face time of which over 50% was spent in counseling.

## 2019-02-25 ENCOUNTER — Other Ambulatory Visit: Payer: Self-pay | Admitting: Obstetrics and Gynecology

## 2019-02-26 NOTE — Telephone Encounter (Signed)
Medication refill request: [name]  Last AEX:  02-20-2018 JJ  Next AEX: message left for patient to call and scheduled  Last MMG (if hormonal medication request): none Refill authorized: Today, please advise.   Medication pended for #28, 0RF. Please refill if appropriate.

## 2019-02-26 NOTE — Telephone Encounter (Signed)
Patient returned call. Patient scheduled for aex on 03-29-19 at 1600. Patient agreeable to date and time of appointment. Patient requesting 90 day supply due to insurance and using mail order pharmacy. RN advised request would be sent to Dr. Talbert Nan for review. Patient agreeable.

## 2019-02-27 ENCOUNTER — Other Ambulatory Visit: Payer: Self-pay

## 2019-02-27 ENCOUNTER — Ambulatory Visit (INDEPENDENT_AMBULATORY_CARE_PROVIDER_SITE_OTHER): Payer: BC Managed Care – PPO | Admitting: Sports Medicine

## 2019-02-27 ENCOUNTER — Ambulatory Visit
Admission: RE | Admit: 2019-02-27 | Discharge: 2019-02-27 | Disposition: A | Discharge status: Home / Self Care | Payer: BC Managed Care – PPO | Source: Ambulatory Visit | Attending: Sports Medicine | Admitting: Sports Medicine

## 2019-02-27 VITALS — BP 130/80 | Ht 63.0 in | Wt 150.0 lb

## 2019-02-27 DIAGNOSIS — M79672 Pain in left foot: Secondary | ICD-10-CM | POA: Diagnosis not present

## 2019-02-27 DIAGNOSIS — M79671 Pain in right foot: Secondary | ICD-10-CM | POA: Diagnosis not present

## 2019-02-28 MED ORDER — IBUPROFEN 600 MG PO TABS: ORAL_TABLET | ORAL | 0 refills | Status: DC

## 2019-02-28 NOTE — Addendum Note (Signed)
Addended by: Renaye Rakers on: 02/28/2019 09:38 AM   Modules accepted: Orders

## 2019-02-28 NOTE — Progress Notes (Addendum)
   Subjective:    Patient ID: Mckenzie Maldonado, female    DOB: 1975-04-03, 44 y.o.   MRN: [id number]  HPI chief complaint: Left foot pain  Very pleasant 44 year old female comes in today complaining of 6 to 8 months of medial sided left foot pain.  She denies any trauma but rather describes a gradual onset of pain that she localizes near the navicular.  She enjoys running and is quite active in general.  She notes that she has some discomfort with exercise but most of her pain is afterwards.  She has begun to notice some swelling around the medial left foot as well.  She has a history of posterior tibialis tendinitis in the right foot which was treated successfully with custom orthotics in 2017.  She feels like the pain in the left foot is similar but she does not remember any focal swelling with the right foot like she is getting with the left.  She denies pain elsewhere in the foot.  She admits that she discontinued wearing her custom orthotics for quite some time but has recently resumed them.  She has also been using icing with some benefit.  Past medical history reviewed Allergies reviewed Medications reviewed    Review of Systems As above    Objective:   Physical Exam  Well-developed, well-nourished.  No acute distress.  Awake alert and oriented x3.  Vital signs reviewed  Left foot: There is mild soft tissue swelling as well as tenderness directly over the area of the navicular.  The navicular also appears to be quite prominent.  Evaluation of the foot in a standing position definitely shows significant pes planus with a too many toe sign.  Good calcaneal inversion when standing on tiptoes.  Right foot: No obvious soft tissue swelling.  Moderate pes planus with standing but not as pronounced as the left foot.  Good calcaneal inversion when standing on tiptoes.  Patient ambulates without significant limp  Brief bedside [facility] ultrasound shows the posterior tibialis tendon to be  unremarkable.  There is some cortical irregularity around the navicular of the left foot which prompted me to get an x-ray.  X-rays of the left foot show a prominent os naviculare with soft tissue swelling.  Comparison views of the right foot also shows an os naviculare but no obvious swelling.       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  Hopefully this will allow the inflammation to settle down.  If symptoms worsen she may need to consider merits of surgical excision.  In the meantime, we will try ibuprofen 600 mg twice daily with food for 5 days.  She may then take it as needed afterwards.

## 2019-03-08 ENCOUNTER — Ambulatory Visit (INDEPENDENT_AMBULATORY_CARE_PROVIDER_SITE_OTHER): Payer: BC Managed Care – PPO | Admitting: Sports Medicine

## 2019-03-08 ENCOUNTER — Other Ambulatory Visit: Payer: Self-pay

## 2019-03-08 ENCOUNTER — Encounter: Payer: Self-pay | Admitting: Sports Medicine

## 2019-03-08 VITALS — BP 136/80 | Ht 63.0 in | Wt 150.0 lb

## 2019-03-08 DIAGNOSIS — M87876 Other osteonecrosis, unspecified foot: Secondary | ICD-10-CM

## 2019-03-08 NOTE — Progress Notes (Signed)
Patient ID: Mckenzie Maldonado, female   DOB: 1975-11-11, 44 y.o.   MRN: [id number]  Patient presents today for custom orthotics.  Please see the office note from February 27, 2019 for details regarding history and physical exam findings.  In short, this patient has a symptomatic left os naviculare.  We are going to make new custom orthotics to see if that will help alleviate her pain and swelling.  If not, then she will need a referral either to Dr. Lucia Gaskins or Dr. Doran Durand to discuss merits of surgical excision.  Patient will follow up with me as needed.  Patient was fitted for a : standard, cushioned, semi-rigid orthotic. The orthotic was heated and afterward the patient stood on the orthotic blank positioned on the orthotic stand. The patient was positioned in subtalar neutral position and 10 degrees of ankle dorsiflexion in a weight bearing stance. After completion of molding, a stable base was applied to the orthotic blank. The blank was ground to a stable position for weight bearing. Size: 8 Base: Blue EVA Posting: none Additional orthotic padding: none  Gait was neutral with orthotics in place.  Patient found them to be comfortable prior to leaving the office.

## 2019-03-21 ENCOUNTER — Other Ambulatory Visit: Payer: Self-pay | Admitting: Sports Medicine

## 2019-03-21 ENCOUNTER — Other Ambulatory Visit: Payer: Self-pay | Admitting: Family Medicine

## 2019-03-21 DIAGNOSIS — R911 Solitary pulmonary nodule: Secondary | ICD-10-CM

## 2019-03-29 ENCOUNTER — Encounter: Payer: Self-pay | Admitting: Obstetrics and Gynecology

## 2019-03-29 ENCOUNTER — Other Ambulatory Visit: Payer: Self-pay

## 2019-03-29 ENCOUNTER — Ambulatory Visit (INDEPENDENT_AMBULATORY_CARE_PROVIDER_SITE_OTHER): Payer: BC Managed Care – PPO | Admitting: Obstetrics and Gynecology

## 2019-03-29 VITALS — BP 120/65 | HR 72 | Temp 98.7°F | Ht 61.5 in | Wt 156.4 lb

## 2019-03-29 DIAGNOSIS — Z3041 Encounter for surveillance of contraceptive pills: Secondary | ICD-10-CM

## 2019-03-29 DIAGNOSIS — Z01419 Encounter for gynecological examination (general) (routine) without abnormal findings: Secondary | ICD-10-CM

## 2019-03-29 DIAGNOSIS — D259 Leiomyoma of uterus, unspecified: Secondary | ICD-10-CM | POA: Diagnosis not present

## 2019-03-29 MED ORDER — NORETHINDRONE ACET-ETHINYL EST 1.5-30 MG-MCG PO TABS: 1.0000 | ORAL_TABLET | Freq: Every day | ORAL | 3 refills | Status: DC

## 2019-03-29 NOTE — Patient Instructions (Signed)

## 2019-03-29 NOTE — Progress Notes (Signed)
44 y.o. G0P0000 Married White or Caucasian Not Hispanic or Latino female here for annual exam.  H/O fibroids, prior to OCP's cycles were very heavy. Occasional deep dyspareunia, positional.  Period Cycle (Days): 28 Period Duration (Days): 4 Period Pattern: Regular Menstrual Flow: Light, Moderate Menstrual Control: Tampon Dysmenorrhea: None   Dad recently diagnosed with stage 4 clear cell cancer, in his lung, bladder and prostate. He is still being evaluated, PET scan on Friday. Parents are together.   Patient's last menstrual period was 03/24/2019 (exact date).          Sexually active: Yes.    The current method of family planning is OCP (estrogen/progesterone).    Exercising: Yes.    cardio and crosstraning 3/4 x week Smoker:  no  Health Maintenance: Pap: 02/21/18 Neg hR HPV neg   07-20-16 negative, HR HPV negative 01-29-13 negative  History of abnormal Pap:  no MMG:  None  BMD:  None  Colonoscopy: none  TDaP:  utd per patient  Gardasil: Declined   reports that she has never smoked. She has never used smokeless tobacco. She reports current alcohol use. She reports that she does not use drugs.Occasional ETOH. Works in Engineer, technical sales  Past Medical History:  Diagnosis Date  . Fibroid     History reviewed. No pertinent surgical history.  Current Outpatient Medications  Medication Sig Dispense Refill  . [name] 1.5/30 1.5-30 MG-MCG tablet TAKE 1 TABLET DAILY 84 tablet 0  . ibuprofen (ADVIL) 600 MG tablet Take 1 tab twice daily with food for 5 days. Then take as needed. 30 tablet 0  . ibuprofen (ADVIL,MOTRIN) 200 MG tablet Take 200 mg by mouth every 6 (six) hours as needed.     No current facility-administered medications for this visit.    Family History  Problem Relation Age of Onset  . Uterine cancer Mother   . Cancer Father 39       small cell, stage 4, lung, prostate and bladder    Review of Systems  All other systems reviewed and are negative.   Exam:   BP 120/65 (BP  Location: Left Arm, Patient Position: Sitting, Cuff Size: Normal)   Pulse 72   Temp 98.7 F (37.1 C) (Temporal)   Ht 5' 1.5" (1.562 m)   Wt 156 lb 6.4 oz (70.9 kg)   LMP 03/24/2019 (Exact Date)   BMI 29.07 kg/m   Weight change: @WEIGHTCHANGE @ Height:   Height: 5' 1.5" (156.2 cm)  Ht Readings from Last 3 Encounters:  03/29/19 5' 1.5" (1.562 m)  03/08/19 5\' 3"  (1.6 m)  02/27/19 5\' 3"  (1.6 m)    General appearance: alert, cooperative and appears stated age Head: Normocephalic, without obvious abnormality, atraumatic Neck: no adenopathy, supple, symmetrical, trachea midline and thyroid normal to inspection and palpation Lungs: clear to auscultation bilaterally Cardiovascular: regular rate and rhythm Breasts: normal appearance, no masses or tenderness Abdomen: soft, non-tender; non distended,  no masses,  no organomegaly Extremities: extremities normal, atraumatic, no cyanosis or edema Skin: Skin color, texture, turgor normal. No rashes or lesions Lymph nodes: Cervical, supraclavicular, and axillary nodes normal. No abnormal inguinal nodes palpated Neurologic: Grossly normal   Pelvic: External genitalia:  no lesions              Urethra:  normal appearing urethra with no masses, tenderness or lesions              Bartholins and Skenes: normal  Vagina: normal appearing vagina with normal color and discharge, no lesions              Cervix: no lesions               Bimanual Exam:  Uterus:  slightly enlarged, irregularly shaped retroverted uterus              Adnexa: no mass, fullness, tenderness               Rectovaginal: Confirms               Anus:  normal sphincter tone, no lesions  Mckenzie Maldonado chaperoned for the exam.  A:  Well Woman with normal exam  Doing well on OCP's  Fibroid uterus  P:   No pap this year  Mammogram, # given  Labs with her primary  Discussed breast self exam  Discussed calcium and vit D intake  Continue OCP's.

## 2019-04-04 ENCOUNTER — Other Ambulatory Visit: Payer: BC Managed Care – PPO

## 2019-04-11 ENCOUNTER — Ambulatory Visit
Admission: RE | Admit: 2019-04-11 | Discharge: 2019-04-11 | Disposition: A | Discharge status: Home / Self Care | Payer: BC Managed Care – PPO | Source: Ambulatory Visit | Attending: Family Medicine | Admitting: Family Medicine

## 2019-04-11 DIAGNOSIS — R911 Solitary pulmonary nodule: Secondary | ICD-10-CM | POA: Diagnosis not present

## 2019-04-18 DIAGNOSIS — L299 Pruritus, unspecified: Secondary | ICD-10-CM | POA: Diagnosis not present

## 2019-04-18 DIAGNOSIS — L309 Dermatitis, unspecified: Secondary | ICD-10-CM | POA: Diagnosis not present

## 2019-04-18 DIAGNOSIS — R635 Abnormal weight gain: Secondary | ICD-10-CM | POA: Diagnosis not present

## 2019-08-02 ENCOUNTER — Telehealth: Payer: Self-pay

## 2019-08-02 NOTE — Telephone Encounter (Signed)
I agree with refilling her pills as outlined to [facility] and to local pharmacy.

## 2019-08-02 NOTE — Telephone Encounter (Signed)
Patient left message regarding discussing medication with nurse.

## 2019-08-02 NOTE — Telephone Encounter (Signed)
Spoke with patient. Patient is on OCP [name] 1.5/30, she is taking continuously active. Is going to run out of medication before refill can be filled. Patient is leaving for vacation next week.   Patient is requesting a new RX to to accommodate taking continuously active and [facility] on [facility] until mail order can arrive. Advised patient I will have to review request with covering provider and return call. Patient agreeable.   Last AEX 03/29/19 No MMG on file.  No AEX scheduled.  Last Rx sent on 03/29/19 #84/3RF   Dr. Quincy Simmonds  -Madaline Brilliant to send Rx for [name] #4pkg, [facility] and [name]?

## 2019-08-03 MED ORDER — NORETHINDRONE ACET-ETHINYL EST 1.5-30 MG-MCG PO TABS: 1.0000 | ORAL_TABLET | Freq: Every day | ORAL | 0 refills | Status: DC

## 2019-08-03 MED ORDER — NORETHINDRONE ACET-ETHINYL EST 1.5-30 MG-MCG PO TABS: 1.0000 | ORAL_TABLET | Freq: Every day | ORAL | 2 refills | Status: DC

## 2019-08-03 NOTE — Telephone Encounter (Signed)
[facility].  Initial RX to [facility] discontinued upon reorder, new Rx placed.  Patient notified of refills.   Routing to provider for final review. Patient is agreeable to disposition. Will close encounter.

## 2019-08-24 ENCOUNTER — Other Ambulatory Visit: Payer: Self-pay | Admitting: Obstetrics and Gynecology

## 2019-08-29 DIAGNOSIS — H6983 Other specified disorders of Eustachian tube, bilateral: Secondary | ICD-10-CM | POA: Diagnosis not present

## 2019-09-20 DIAGNOSIS — Q6689 Other  specified congenital deformities of feet: Secondary | ICD-10-CM | POA: Diagnosis not present

## 2019-09-20 DIAGNOSIS — M79672 Pain in left foot: Secondary | ICD-10-CM | POA: Diagnosis not present

## 2019-09-20 DIAGNOSIS — M25572 Pain in left ankle and joints of left foot: Secondary | ICD-10-CM | POA: Diagnosis not present

## 2019-10-08 DIAGNOSIS — M79672 Pain in left foot: Secondary | ICD-10-CM | POA: Diagnosis not present

## 2019-10-17 DIAGNOSIS — Q742 Other congenital malformations of lower limb(s), including pelvic girdle: Secondary | ICD-10-CM | POA: Diagnosis not present

## 2019-10-30 DIAGNOSIS — X58XXXA Exposure to other specified factors, initial encounter: Secondary | ICD-10-CM | POA: Diagnosis not present

## 2019-10-30 DIAGNOSIS — G8918 Other acute postprocedural pain: Secondary | ICD-10-CM | POA: Diagnosis not present

## 2019-10-30 DIAGNOSIS — S93612A Sprain of tarsal ligament of left foot, initial encounter: Secondary | ICD-10-CM | POA: Diagnosis not present

## 2019-10-30 DIAGNOSIS — S93315A Dislocation of tarsal joint of left foot, initial encounter: Secondary | ICD-10-CM | POA: Diagnosis not present

## 2019-10-30 DIAGNOSIS — M76822 Posterior tibial tendinitis, left leg: Secondary | ICD-10-CM | POA: Diagnosis not present

## 2019-10-30 DIAGNOSIS — M659 Synovitis and tenosynovitis, unspecified: Secondary | ICD-10-CM | POA: Diagnosis not present

## 2019-10-30 DIAGNOSIS — Q6689 Other  specified congenital deformities of feet: Secondary | ICD-10-CM | POA: Diagnosis not present

## 2019-10-30 DIAGNOSIS — Y999 Unspecified external cause status: Secondary | ICD-10-CM | POA: Diagnosis not present

## 2019-11-12 DIAGNOSIS — Q742 Other congenital malformations of lower limb(s), including pelvic girdle: Secondary | ICD-10-CM | POA: Diagnosis not present

## 2019-12-14 DIAGNOSIS — M79672 Pain in left foot: Secondary | ICD-10-CM | POA: Diagnosis not present

## 2020-01-17 DIAGNOSIS — M79672 Pain in left foot: Secondary | ICD-10-CM | POA: Diagnosis not present

## 2020-01-22 DIAGNOSIS — M79672 Pain in left foot: Secondary | ICD-10-CM | POA: Diagnosis not present

## 2020-01-22 DIAGNOSIS — M25572 Pain in left ankle and joints of left foot: Secondary | ICD-10-CM | POA: Insufficient documentation

## 2020-02-04 DIAGNOSIS — M25572 Pain in left ankle and joints of left foot: Secondary | ICD-10-CM | POA: Diagnosis not present

## 2020-02-04 DIAGNOSIS — M79672 Pain in left foot: Secondary | ICD-10-CM | POA: Diagnosis not present

## 2020-02-11 DIAGNOSIS — M25572 Pain in left ankle and joints of left foot: Secondary | ICD-10-CM | POA: Diagnosis not present

## 2020-02-11 DIAGNOSIS — M79672 Pain in left foot: Secondary | ICD-10-CM | POA: Diagnosis not present

## 2020-02-13 DIAGNOSIS — Q742 Other congenital malformations of lower limb(s), including pelvic girdle: Secondary | ICD-10-CM | POA: Diagnosis not present

## 2020-02-26 DIAGNOSIS — M79672 Pain in left foot: Secondary | ICD-10-CM | POA: Diagnosis not present

## 2020-02-26 DIAGNOSIS — M25572 Pain in left ankle and joints of left foot: Secondary | ICD-10-CM | POA: Diagnosis not present

## 2020-03-03 DIAGNOSIS — M25572 Pain in left ankle and joints of left foot: Secondary | ICD-10-CM | POA: Diagnosis not present

## 2020-03-03 DIAGNOSIS — M79672 Pain in left foot: Secondary | ICD-10-CM | POA: Diagnosis not present

## 2020-03-10 DIAGNOSIS — M79672 Pain in left foot: Secondary | ICD-10-CM | POA: Diagnosis not present

## 2020-03-10 DIAGNOSIS — M25572 Pain in left ankle and joints of left foot: Secondary | ICD-10-CM | POA: Diagnosis not present

## 2020-04-01 DIAGNOSIS — M25572 Pain in left ankle and joints of left foot: Secondary | ICD-10-CM | POA: Diagnosis not present

## 2020-04-01 DIAGNOSIS — M79672 Pain in left foot: Secondary | ICD-10-CM | POA: Diagnosis not present

## 2020-04-11 ENCOUNTER — Other Ambulatory Visit: Payer: Self-pay | Admitting: Obstetrics and Gynecology

## 2020-05-29 ENCOUNTER — Ambulatory Visit (INDEPENDENT_AMBULATORY_CARE_PROVIDER_SITE_OTHER): Payer: BC Managed Care – PPO | Admitting: Obstetrics and Gynecology

## 2020-05-29 ENCOUNTER — Encounter: Payer: Self-pay | Admitting: Obstetrics and Gynecology

## 2020-05-29 ENCOUNTER — Other Ambulatory Visit: Payer: Self-pay

## 2020-05-29 VITALS — BP 122/70 | HR 75

## 2020-05-29 DIAGNOSIS — N760 Acute vaginitis: Secondary | ICD-10-CM | POA: Diagnosis not present

## 2020-05-29 DIAGNOSIS — B373 Candidiasis of vulva and vagina: Secondary | ICD-10-CM

## 2020-05-29 DIAGNOSIS — B3731 Acute candidiasis of vulva and vagina: Secondary | ICD-10-CM

## 2020-05-29 LAB — WET PREP FOR TRICH, YEAST, CLUE

## 2020-05-29 MED ORDER — FLUCONAZOLE 150 MG PO TABS: 150.0000 mg | ORAL_TABLET | Freq: Once | ORAL | 0 refills | Status: AC

## 2020-05-29 MED ORDER — BETAMETHASONE VALERATE 0.1 % EX OINT: TOPICAL_OINTMENT | CUTANEOUS | 0 refills | Status: DC

## 2020-05-29 NOTE — Progress Notes (Signed)
GYNECOLOGY  VISIT   HPI: 45 y.o.   Married White or Caucasian Not Hispanic or Latino  female   G0P0000  here for moderate vaginal pruritus x 1 week. No abnormal vaginal discharge, on her cycle currently (light). No odor. No recent antibiotics. No change in soap or detergent.    GYNECOLOGIC HISTORY: Last LMP 05/26/20 Contraception:OCP's Menopausal hormone therapy: none        OB History    Gravida  0   Para  0   Term  0   Preterm  0   AB  0   Living  0     SAB  0   IAB  0   Ectopic  0   Multiple  0   Live Births  0              Patient Active Problem List   Diagnosis Date Noted  . Family history of uterine cancer 02/20/2018  . Right knee pain 12/20/2016    Past Medical History:  Diagnosis Date  . Fibroid     No past surgical history on file.  Current Outpatient Medications  Medication Sig Dispense Refill  . Norethindrone Acetate-Ethinyl Estradiol ([name] 1.5/30) 1.5-30 MG-MCG tablet Take 1 tablet by mouth daily. 112 tablet 2   No current facility-administered medications for this visit.     ALLERGIES: Patient has no known allergies.  Family History  Problem Relation Age of Onset  . Uterine cancer Mother   . Cancer Father 25       small cell, stage 4, lung, prostate and bladder    Social History   Socioeconomic History  . Marital status: Married    Spouse name: Not on file  . Number of children: Not on file  . Years of education: Not on file  . Highest education level: Not on file  Occupational History  . Not on file  Tobacco Use  . Smoking status: Never Smoker  . Smokeless tobacco: Never Used  Vaping Use  . Vaping Use: Never used  Substance and Sexual Activity  . Alcohol use: Yes    Alcohol/week: 0.0 standard drinks    Comment: 1-2/week  . Drug use: Never  . Sexual activity: Yes    Birth control/protection: Pill  Other Topics Concern  . Not on file  Social History Narrative  . Not on file   Social Determinants of Health    Financial Resource Strain: Not on file  Food Insecurity: Not on file  Transportation Needs: Not on file  Physical Activity: Not on file  Stress: Not on file  Social Connections: Not on file  Intimate Partner Violence: Not on file    Review of Systems  Constitutional: Negative.   HENT: Negative.   Eyes: Negative.   Respiratory: Negative.   Cardiovascular: Negative.   Gastrointestinal: Negative.   Genitourinary: Negative.   Musculoskeletal: Negative.   Skin: Negative.     PHYSICAL EXAMINATION:    There were no vitals taken for this visit.    General appearance: alert, cooperative and appears stated age   Pelvic: External genitalia:  no lesions, + erythema              Urethra:  normal appearing urethra with no masses, tenderness or lesions              Bartholins and Skenes: normal                 Vagina: normal appearing vagina with normal color  and discharge, no lesions              Cervix: no lesions  Chaperone was present for exam.  1. Vulvovaginitis - WET PREP FOR TRICH, YEAST, CLUE: + yeast - betamethasone valerate ointment (VALISONE) 0.1 %; Use a pea sized amount topically BID for up to 2 weeks if needed.  Dispense: 30 g; Refill: 0  2. Yeast vaginitis - fluconazole (DIFLUCAN) 150 MG tablet; Take 1 tablet (150 mg total) by mouth once for 1 dose. Take one tablet.  Repeat in 72 hours if symptoms are not completely resolved.  Dispense: 2 tablet; Refill: 0

## 2020-05-29 NOTE — Patient Instructions (Signed)
Vaginal Yeast Infection, Adult  Vaginal yeast infection is a condition that causes vaginal discharge as well as soreness, swelling, and redness (inflammation) of the vagina. This is a common condition. Some women get this infection frequently. What are the causes? This condition is caused by a change in the normal balance of the yeast (candida) and bacteria that live in the vagina. This change causes an overgrowth of yeast, which causes the inflammation. What increases the risk? The condition is more likely to develop in women who:  Take antibiotic medicines.  Have diabetes.  Take birth control pills.  Are pregnant.  Douche often.  Have a weak body defense system (immune system).  Have been taking steroid medicines for a long time.  Frequently wear tight clothing. What are the signs or symptoms? Symptoms of this condition include:  White, thick, creamy vaginal discharge.  Swelling, itching, redness, and irritation of the vagina. The lips of the vagina (vulva) may be affected as well.  Pain or a burning feeling while urinating.  Pain during sex. How is this diagnosed? This condition is diagnosed based on:  Your medical history.  A physical exam.  A pelvic exam. Your health care provider will examine a sample of your vaginal discharge under a microscope. Your health care provider may send this sample for testing to confirm the diagnosis. How is this treated? This condition is treated with medicine. Medicines may be over-the-counter or prescription. You may be told to use one or more of the following:  Medicine that is taken by mouth (orally).  Medicine that is applied as a cream (topically).  Medicine that is inserted directly into the vagina (suppository). Follow these instructions at home: Lifestyle  Do not have sex until your health care provider approves. Tell your sex partner that you have a yeast infection. That person should go to his or her health care  provider and ask if they should also be treated.  Do not wear tight clothes, such as pantyhose or tight pants.  Wear breathable cotton underwear. General instructions  Take or apply over-the-counter and prescription medicines only as told by your health care provider.  Eat more yogurt. This may help to keep your yeast infection from returning.  Do not use tampons until your health care provider approves.  Try taking a sitz bath to help with discomfort. This is a warm water bath that is taken while you are sitting down. The water should only come up to your hips and should cover your buttocks. Do this 3-4 times per day or as told by your health care provider.  Do not douche.  If you have diabetes, keep your blood sugar levels under control.  Keep all follow-up visits as told by your health care provider. This is important.   Contact a health care provider if:  You have a fever.  Your symptoms go away and then return.  Your symptoms do not get better with treatment.  Your symptoms get worse.  You have new symptoms.  You develop blisters in or around your vagina.  You have blood coming from your vagina and it is not your menstrual period.  You develop pain in your abdomen. Summary  Vaginal yeast infection is a condition that causes discharge as well as soreness, swelling, and redness (inflammation) of the vagina.  This condition is treated with medicine. Medicines may be over-the-counter or prescription.  Take or apply over-the-counter and prescription medicines only as told by your health care provider.  Do not   douche. Do not have sex or use tampons until your health care provider approves.  Contact a health care provider if your symptoms do not get better with treatment or your symptoms go away and then return. This information is not intended to replace advice given to you by your health care provider. Make sure you discuss any questions you have with your health care  provider. Document Revised: 07/28/2018 Document Reviewed: 05/16/2017 Elsevier Patient Education  2021 [employer]  

## 2020-06-10 ENCOUNTER — Telehealth: Payer: Self-pay

## 2020-06-10 NOTE — Telephone Encounter (Signed)
Patient was in office on 05/29/20.  1. Vulvovaginitis - WET PREP FOR TRICH, YEAST, CLUE: + yeast - betamethasone valerate ointment (VALISONE) 0.1 %; Use a pea sized amount topically BID for up to 2 weeks if needed.  Dispense: 30 g; Refill: 0  2. Yeast vaginitis - fluconazole (DIFLUCAN) 150 MG tablet; Take 1 tablet (150 mg total) by mouth once for 1 dose. Take one tablet.  Repeat in 72 hours if symptoms are not completely resolved.  Dispense: 2 tablet; Refill: 0   Patient called today stating that after taking the first Diflucan there was no relief so she took the 2nd one 72 hours after. After taking it she had 85-90% improvement but it never completely went away. Now it is feeling worse like it is coming back. Not yet as bad as when she was in the office but same symptoms.

## 2020-06-10 NOTE — Telephone Encounter (Signed)
Please call in one more diflucan for her, 150 mg po x 1. If she doesn't feel better in a couple of days she should be seen.

## 2020-06-11 ENCOUNTER — Other Ambulatory Visit: Payer: Self-pay

## 2020-06-11 MED ORDER — FLUCONAZOLE 150 MG PO TABS: 150.0000 mg | ORAL_TABLET | Freq: Once | ORAL | 0 refills | Status: AC

## 2020-06-11 NOTE — Telephone Encounter (Signed)
Spoke with patient and informed her. Rx sent. 

## 2020-06-23 DIAGNOSIS — U071 COVID-19: Secondary | ICD-10-CM | POA: Diagnosis not present

## 2020-09-01 ENCOUNTER — Other Ambulatory Visit: Payer: Self-pay | Admitting: *Deleted

## 2020-09-01 MED ORDER — NORETHINDRONE ACET-ETHINYL EST 1.5-30 MG-MCG PO TABS: 1.0000 | ORAL_TABLET | Freq: Every day | ORAL | 0 refills | Status: DC

## 2020-09-01 NOTE — Telephone Encounter (Signed)
Patient has annual exam scheduled on 914/22, requesting refill on Hayley 1.5/30 mcg tablet. Last annual exam was in 03/2019,No mammogram in epic.

## 2020-09-01 NOTE — Telephone Encounter (Signed)
I've refilled her script, but please ask the patient to schedule her mammogram and give her the # for the breast center. Please let her know that she needs a mammogram if she is going to continue on OCP's.

## 2020-09-01 NOTE — Telephone Encounter (Signed)
Patient informed. 

## 2020-09-22 NOTE — Progress Notes (Signed)
45 y.o. G0P0000 Married White or Caucasian Not Hispanic or Latino female here for annual exam. She  would like to talk about her birth control. She is having mood swings itching skin.  She was changed from a 20 mcg pill to a 30 mcg pill in 2/20 for BTB. She feels like she is moody on this pill. She wants to try going back to a lower dose pill. She has always had itchy, sensitive skin. Seems worse in the last few years.  Period Cycle (Days): 30 Period Duration (Days): 4-5 Period Pattern: Regular Menstrual Flow: Light Menstrual Control: Tampon Menstrual Control Change Freq (Hours): 5 Dysmenorrhea: (!) Mild H/O fibroids.  Her dad died on 25-Jan-2020.   Patient's last menstrual period was 09/03/2020.          Sexually active: Yes.    The current method of family planning is OCP (estrogen/progesterone).    Exercising: Yes.    Cardio  Smoker:  no  Health Maintenance: Pap:  02-21-18 normal, neg HR HPV: 07-20-16 negative, HR HPV negative History of abnormal Pap:   MMG:  never, scheduled for 10/28/20 BMD:   N/A Colonoscopy: N/A TDaP:  UTD Gardasil: none    reports that she has never smoked. She has never used smokeless tobacco. She reports current alcohol use. She reports that she does not use drugs.Works in Engineer, technical sales  Past Medical History:  Diagnosis Date   Fibroid     Past Surgical History:  Procedure Laterality Date   FOOT SURGERY Left     Current Outpatient Medications  Medication Sig Dispense Refill   loratadine (CLARITIN) 10 MG tablet Take 10 mg by mouth daily.     Norethindrone Acetate-Ethinyl Estradiol ([name] 1.5/30) 1.5-30 MG-MCG tablet Take 1 tablet by mouth daily. 112 tablet 0   No current facility-administered medications for this visit.    Family History  Problem Relation Age of Onset   Uterine cancer Mother    Cancer Father 26       small cell, stage 4, lung, prostate and bladder    Review of Systems  All other systems reviewed and are negative.  Exam:   BP 110/72    Pulse 78   Ht '5\' 3"'$  (1.6 m)   Wt 156 lb 9.6 oz (71 kg)   LMP 09/03/2020   SpO2 99%   BMI 27.74 kg/m   Weight change: '@WEIGHTCHANGE'$ @ Height:   Height: '5\' 3"'$  (160 cm)  Ht Readings from Last 3 Encounters:  09/24/20 '5\' 3"'$  (1.6 m)  03/29/19 5' 1.5" (1.562 m)  03/08/19 '5\' 3"'$  (1.6 m)    General appearance: alert, cooperative and appears stated age Head: Normocephalic, without obvious abnormality, atraumatic Neck: no adenopathy, supple, symmetrical, trachea midline and thyroid normal to inspection and palpation Lungs: clear to auscultation bilaterally Cardiovascular: regular rate and rhythm Breasts: normal appearance, no masses or tenderness Abdomen: soft, non-tender; non distended,  no masses,  no organomegaly Extremities: extremities normal, atraumatic, no cyanosis or edema Skin: Skin color, texture, turgor normal. No rashes or lesions Lymph nodes: Cervical, supraclavicular, and axillary nodes normal. No abnormal inguinal nodes palpated Neurologic: Grossly normal   Pelvic: External genitalia:  no lesions              Urethra:  normal appearing urethra with no masses, tenderness or lesions              Bartholins and Skenes: normal  Vagina: normal appearing vagina with normal color and discharge, no lesions              Cervix: no lesions               Bimanual Exam:  Uterus:  normal size, contour, position, consistency, mobility, non-tender              Adnexa: no mass, fullness, tenderness               Rectovaginal: Confirms               Anus:  normal sphincter tone, no lesions  Gae Dry chaperoned for the exam.  1. Well woman exam Labs with primary No pap this year Discussed breast self exam Discussed calcium and vit D intake   2. General counseling and advice on female contraception Not liking her current pill, having mood changes. Will try a different pill - drospirenone-ethinyl estradiol (YAZ) 3-0.02 MG tablet; Take 1 tablet by mouth daily.   Dispense: 84 tablet; Refill: 3 -Discussed the possible increase risk of blood clots on this pill

## 2020-09-24 ENCOUNTER — Other Ambulatory Visit: Payer: Self-pay

## 2020-09-24 ENCOUNTER — Ambulatory Visit (INDEPENDENT_AMBULATORY_CARE_PROVIDER_SITE_OTHER): Payer: BC Managed Care – PPO | Admitting: Obstetrics and Gynecology

## 2020-09-24 ENCOUNTER — Encounter: Payer: Self-pay | Admitting: Obstetrics and Gynecology

## 2020-09-24 ENCOUNTER — Other Ambulatory Visit: Payer: Self-pay | Admitting: Family Medicine

## 2020-09-24 VITALS — BP 110/72 | HR 78 | Ht 63.0 in | Wt 156.6 lb

## 2020-09-24 DIAGNOSIS — Z01419 Encounter for gynecological examination (general) (routine) without abnormal findings: Secondary | ICD-10-CM

## 2020-09-24 DIAGNOSIS — Z3009 Encounter for other general counseling and advice on contraception: Secondary | ICD-10-CM | POA: Diagnosis not present

## 2020-09-24 DIAGNOSIS — Z1231 Encounter for screening mammogram for malignant neoplasm of breast: Secondary | ICD-10-CM

## 2020-09-24 MED ORDER — DROSPIRENONE-ETHINYL ESTRADIOL 3-0.02 MG PO TABS: 1.0000 | ORAL_TABLET | Freq: Every day | ORAL | 3 refills | Status: DC

## 2020-09-24 NOTE — Patient Instructions (Signed)

## 2020-10-22 ENCOUNTER — Other Ambulatory Visit: Payer: Self-pay | Admitting: Family Medicine

## 2020-10-22 DIAGNOSIS — R911 Solitary pulmonary nodule: Secondary | ICD-10-CM

## 2020-10-28 ENCOUNTER — Ambulatory Visit: Payer: BC Managed Care – PPO

## 2020-11-24 ENCOUNTER — Other Ambulatory Visit: Payer: Self-pay

## 2020-11-24 ENCOUNTER — Ambulatory Visit
Admission: RE | Admit: 2020-11-24 | Discharge: 2020-11-24 | Disposition: A | Discharge status: Home / Self Care | Payer: BC Managed Care – PPO | Source: Ambulatory Visit | Attending: Family Medicine | Admitting: Family Medicine

## 2020-11-24 DIAGNOSIS — Z1231 Encounter for screening mammogram for malignant neoplasm of breast: Secondary | ICD-10-CM | POA: Diagnosis not present

## 2020-11-26 ENCOUNTER — Other Ambulatory Visit: Payer: Self-pay | Admitting: Family Medicine

## 2020-11-26 DIAGNOSIS — R928 Other abnormal and inconclusive findings on diagnostic imaging of breast: Secondary | ICD-10-CM

## 2020-12-17 ENCOUNTER — Ambulatory Visit
Admission: RE | Admit: 2020-12-17 | Discharge: 2020-12-17 | Disposition: A | Discharge status: Home / Self Care | Payer: BC Managed Care – PPO | Source: Ambulatory Visit | Attending: Family Medicine | Admitting: Family Medicine

## 2020-12-17 ENCOUNTER — Other Ambulatory Visit: Payer: Self-pay | Admitting: Family Medicine

## 2020-12-17 DIAGNOSIS — R928 Other abnormal and inconclusive findings on diagnostic imaging of breast: Secondary | ICD-10-CM

## 2020-12-17 DIAGNOSIS — N6001 Solitary cyst of right breast: Secondary | ICD-10-CM | POA: Diagnosis not present

## 2020-12-17 DIAGNOSIS — N6310 Unspecified lump in the right breast, unspecified quadrant: Secondary | ICD-10-CM

## 2020-12-17 DIAGNOSIS — N6320 Unspecified lump in the left breast, unspecified quadrant: Secondary | ICD-10-CM

## 2020-12-17 DIAGNOSIS — R922 Inconclusive mammogram: Secondary | ICD-10-CM | POA: Diagnosis not present

## 2020-12-29 ENCOUNTER — Other Ambulatory Visit: Payer: BC Managed Care – PPO

## 2021-01-07 IMAGING — CT CT ABD-PELV W/ CM
2 of 4 series · 13 of 46 positions shown, 15 images · IV contrast (iopamidol)
Comparison: Pelvic ultrasound May 29, 2018

CLINICAL DATA: Lower abdominal/pelvic pain

EXAM:
CT ABDOMEN AND PELVIS WITH CONTRAST
TECHNIQUE: Multidetector CT imaging of the abdomen and pelvis was performed
using the standard protocol following bolus administration of
intravenous contrast.
CONTRAST:  100mL A7AZ5M-QYY IOPAMIDOL (A7AZ5M-QYY) INJECTION 61%

[Series 2: abd pelvis 5.00 br40 s3 axial · axial · 0.58mm/px · z∈[+1232,+1617]mm · 10 of 93 slices shown, 12 images]
[im 8/93  soft-tissue]
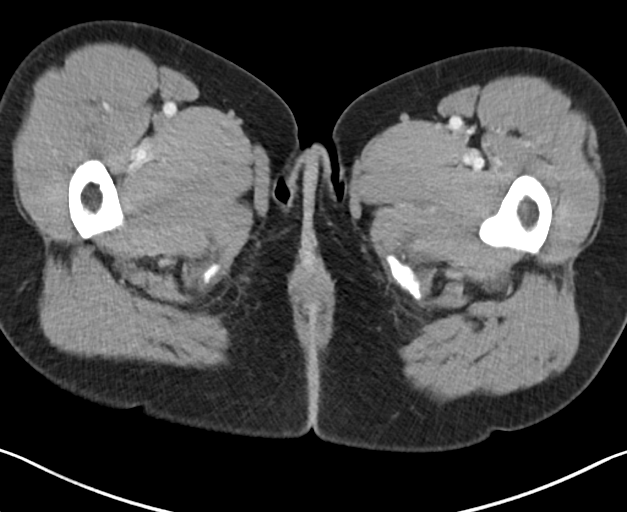
[im 8/93  bone]
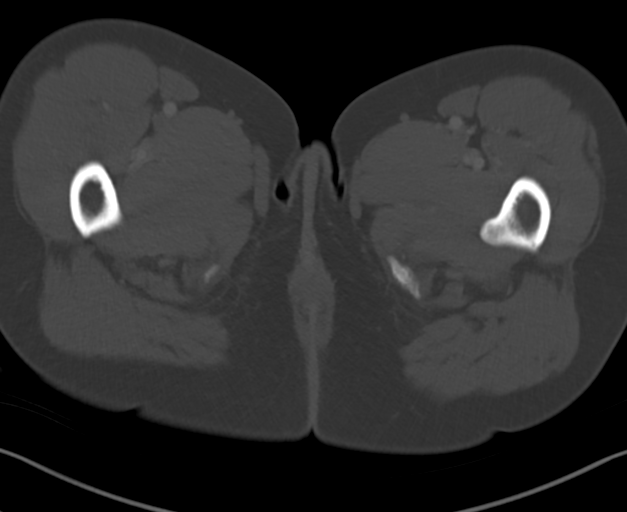
[im 16/93  soft-tissue]
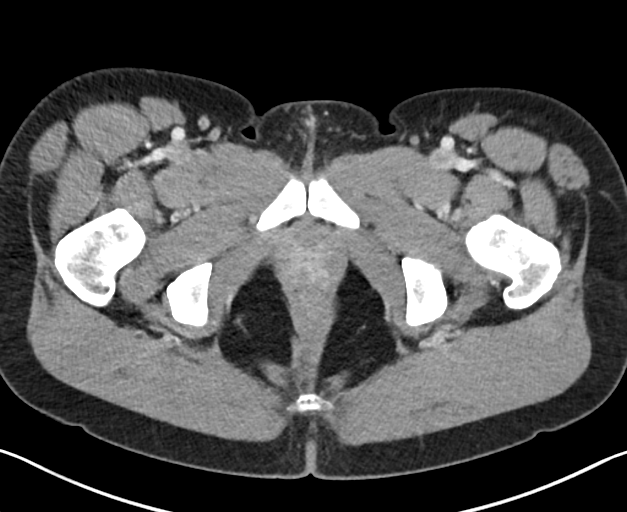
[im 24/93  soft-tissue]
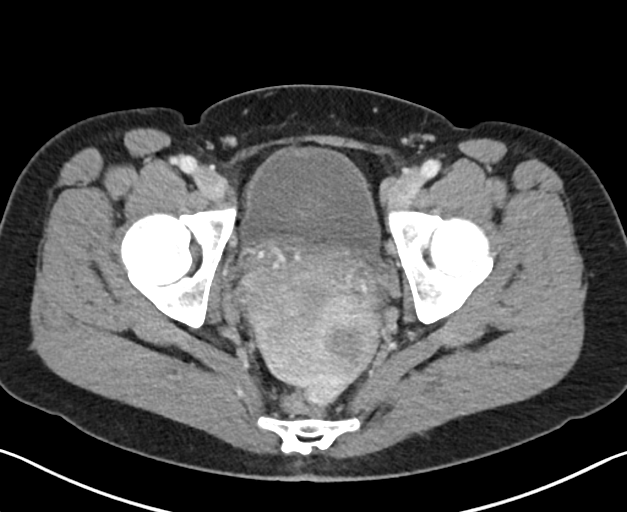
[im 35/93  soft-tissue]
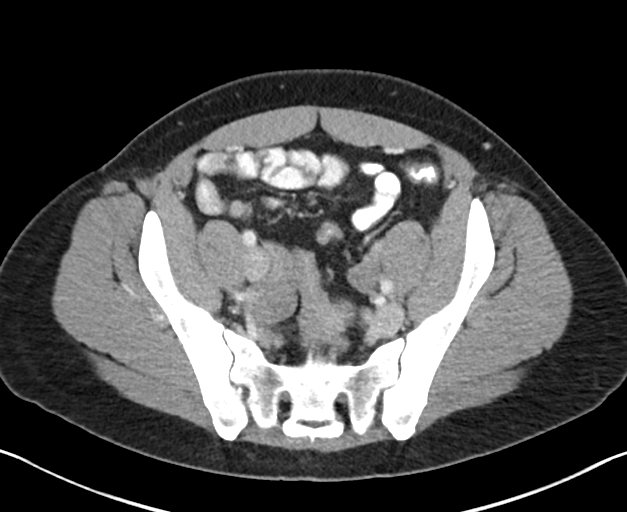
[im 43/93  soft-tissue]
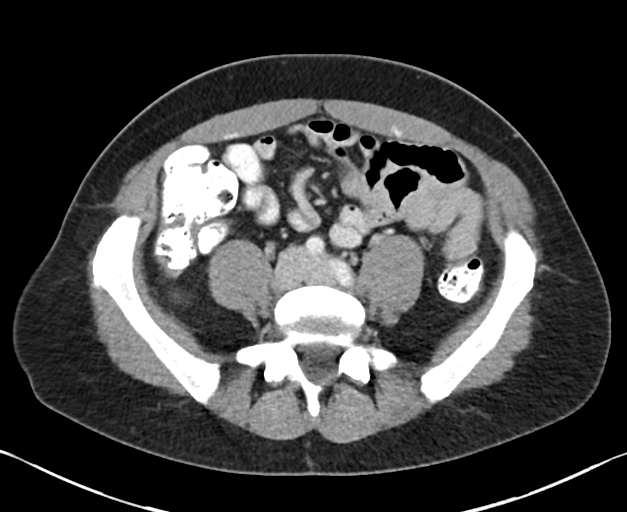
[im 50/93  soft-tissue]
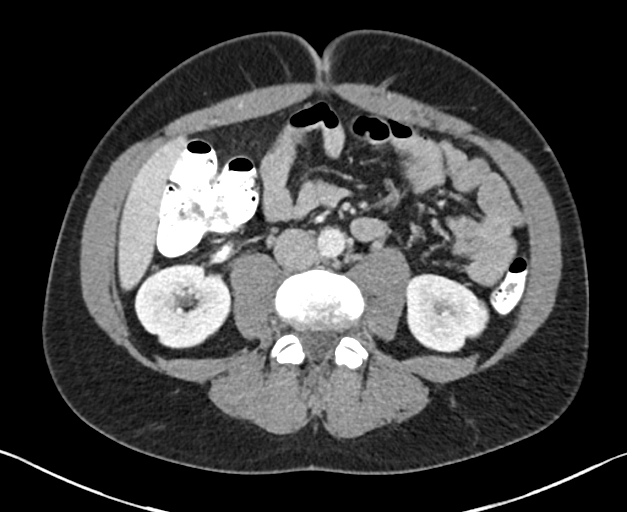
[im 58/93  soft-tissue]
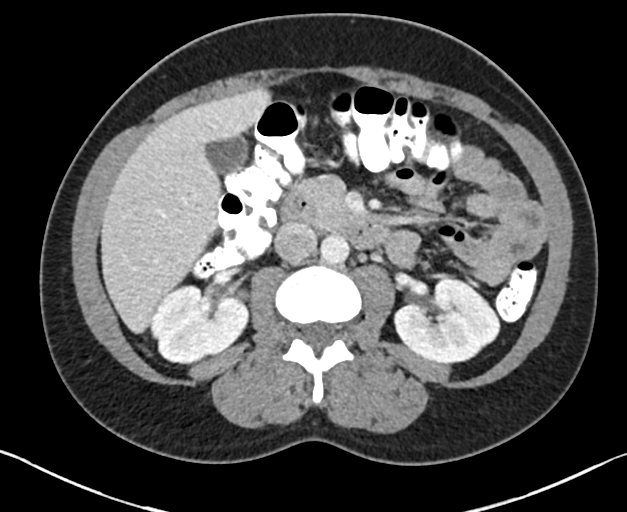
[im 70/93  soft-tissue]
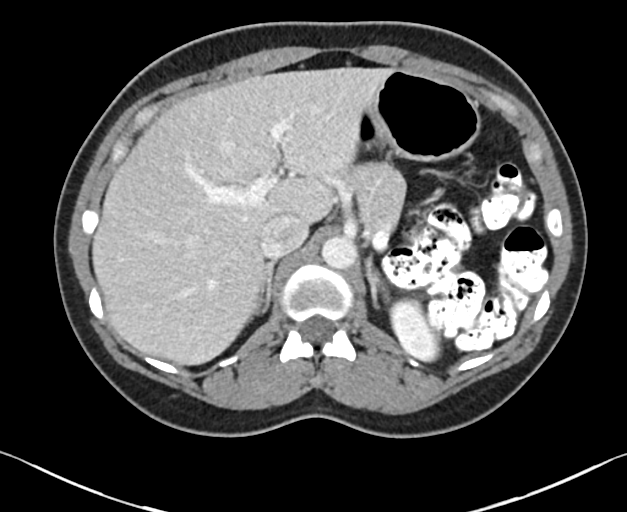
[im 77/93  soft-tissue]
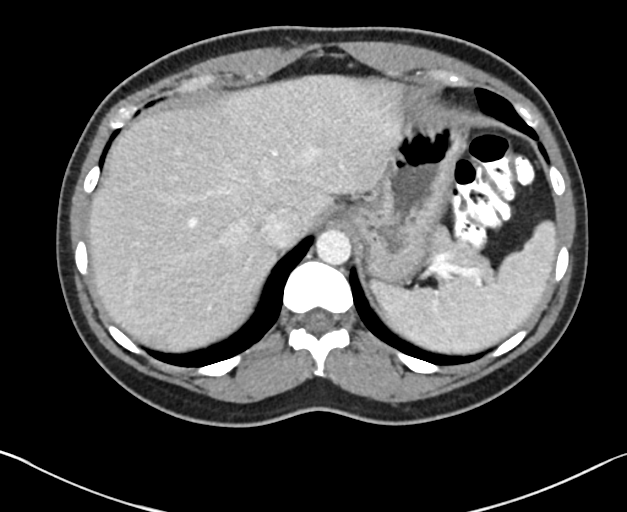
[im 77/93  bone]
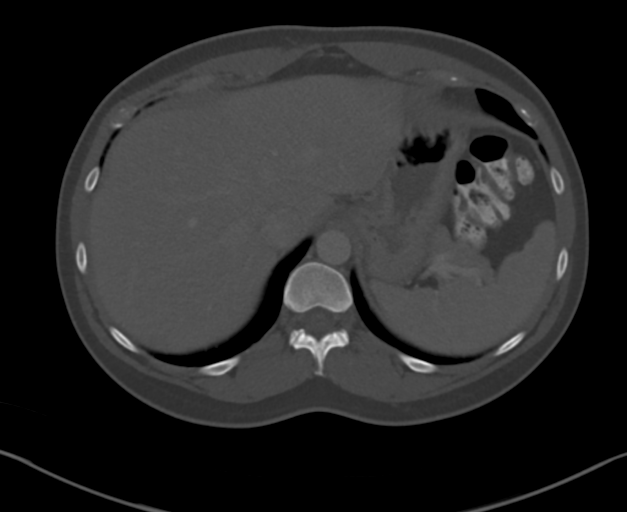
[im 85/93  soft-tissue]
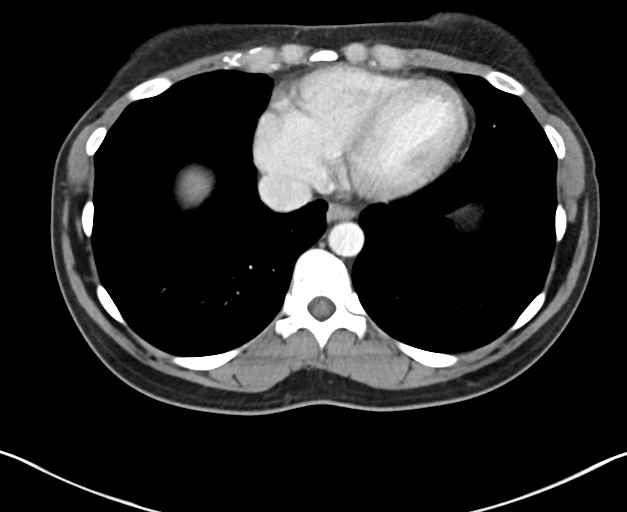

[Series 6: abd pelvis 2.00 br40 s3 cor · coronal · 0.71mm/px · 3 of 147 slices shown]
[im 49/147  soft-tissue]
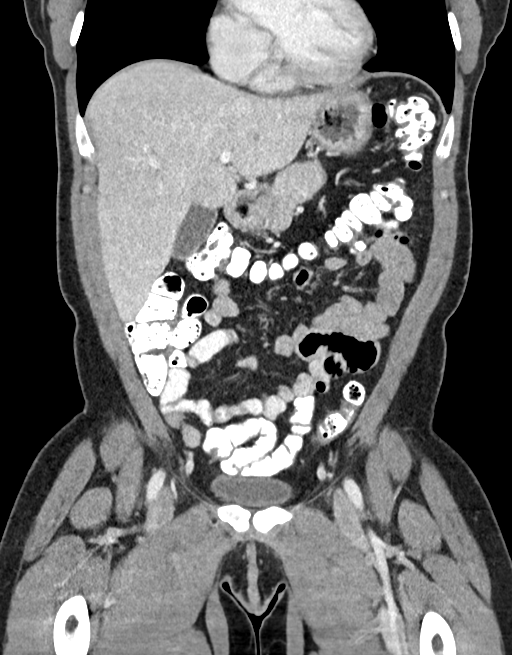
[im 65/147  soft-tissue]
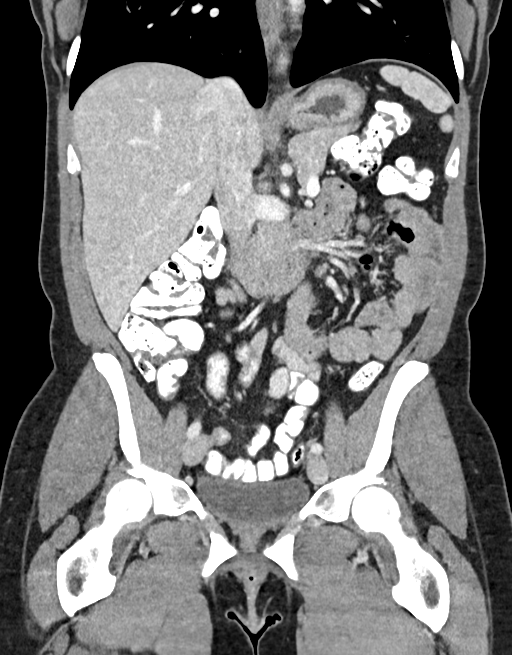
[im 82/147  soft-tissue]
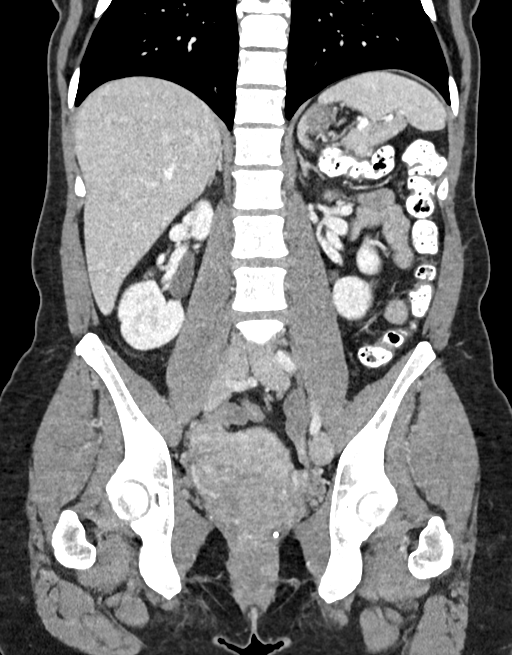

[13 of 46 positions shown; findings below may reference images not displayed]

FINDINGS: Lower chest: On axial slice 7 series 4, there is a focal 8 x 7 mm
nodular opacity in the lateral segment left lower lobe. Lung bases
otherwise are clear.

Hepatobiliary: No focal liver lesions are evident beyond fatty
infiltration near the fissure for the ligamentum teres. Gallbladder
wall is not appreciably thickened. There is no biliary duct
dilatation.

Pancreas: There is no pancreatic mass or inflammatory focus.

Spleen: No splenic lesions are apparent.

Adrenals/Urinary Tract: Adrenals bilaterally appear normal. There is
no demonstrable renal mass or hydronephrosis on either side. There
are fetal lobulations bilaterally, an anatomic variant. There is no
evident renal or ureteral calculus on either side. Urinary bladder
is midline with wall thickness within normal limits.

Stomach/Bowel: There is no appreciable bowel wall or mesenteric
thickening. There is no appreciable bowel obstruction. The terminal
ileum appears unremarkable. There is no evident free air or portal
venous air.

Vascular/Lymphatic: There is slight calcification in the distal
aorta. No aneurysm evident. Major mesenteric arterial vessels appear
patent. There is no adenopathy in the abdomen or pelvis.

Reproductive: The uterus is retroverted. There are multiple masses
throughout the uterus consistent with diffuse leiomyomatous change.
Largest individual mass measures approximately 6 x 5 cm. There are
no extrauterine pelvic masses apparent.

Other: The appendix appears normal. No ascites or abscess evident in
the abdomen or pelvis.

Musculoskeletal: No blastic or lytic bone lesions. There are no
intramuscular or abdominal wall lesions.
IMPRESSION: 1. 8 x 7 mm nodular opacity in the left lower lobe. Non-contrast
chest CT at 6-12 months is recommended. If the nodule is stable at
time of repeat CT, then future CT at 18-24 months (from today's
scan) is considered optional for low-risk patients, but is
recommended for high-risk patients. This recommendation follows the
consensus statement: Guidelines for Management of Incidental
Pulmonary Nodules Detected on CT Images: From the [HOSPITAL]

2.  Multiple uterine leiomyomas noted.  Uterus retroverted.

3. No demonstrable bowel obstruction. No evident diverticulitis. No
abscess in the abdomen or pelvis. Appendix appears normal.

4. No evident renal or ureteral calculus. No hydronephrosis on
either side. Urinary bladder wall thickness within normal limits.

## 2021-01-13 ENCOUNTER — Ambulatory Visit
Admission: RE | Admit: 2021-01-13 | Discharge: 2021-01-13 | Disposition: A | Discharge status: Home / Self Care | Payer: BC Managed Care – PPO | Source: Ambulatory Visit | Attending: Family Medicine | Admitting: Family Medicine

## 2021-01-13 DIAGNOSIS — R911 Solitary pulmonary nodule: Secondary | ICD-10-CM

## 2021-06-04 ENCOUNTER — Other Ambulatory Visit: Payer: Self-pay | Admitting: Obstetrics and Gynecology

## 2021-06-04 DIAGNOSIS — Z3009 Encounter for other general counseling and advice on contraception: Secondary | ICD-10-CM

## 2021-06-04 NOTE — Telephone Encounter (Signed)
I called pharmacy because Rx sent back on 09/24/20 had # 84 x 3 refills.  Pharmacy said that someone there "closed it out" (in error?) and there are no notes why it was closed.  Will need to be resent.

## 2021-06-18 ENCOUNTER — Other Ambulatory Visit: Payer: BC Managed Care – PPO

## 2021-06-25 ENCOUNTER — Ambulatory Visit
Admission: RE | Admit: 2021-06-25 | Discharge: 2021-06-25 | Disposition: A | Discharge status: Home / Self Care | Payer: BC Managed Care – PPO | Source: Ambulatory Visit | Attending: Family Medicine | Admitting: Family Medicine

## 2021-06-25 ENCOUNTER — Other Ambulatory Visit: Payer: Self-pay | Admitting: Family Medicine

## 2021-06-25 DIAGNOSIS — N6310 Unspecified lump in the right breast, unspecified quadrant: Secondary | ICD-10-CM

## 2021-06-25 DIAGNOSIS — N6314 Unspecified lump in the right breast, lower inner quadrant: Secondary | ICD-10-CM | POA: Diagnosis not present

## 2021-06-25 DIAGNOSIS — N6320 Unspecified lump in the left breast, unspecified quadrant: Secondary | ICD-10-CM

## 2021-06-25 DIAGNOSIS — N6321 Unspecified lump in the left breast, upper outer quadrant: Secondary | ICD-10-CM | POA: Diagnosis not present

## 2021-10-20 DIAGNOSIS — R519 Headache, unspecified: Secondary | ICD-10-CM | POA: Diagnosis not present

## 2021-10-23 ENCOUNTER — Other Ambulatory Visit: Payer: Self-pay | Admitting: Family Medicine

## 2021-10-23 DIAGNOSIS — R519 Headache, unspecified: Secondary | ICD-10-CM

## 2021-10-24 ENCOUNTER — Emergency Department (HOSPITAL_BASED_OUTPATIENT_CLINIC_OR_DEPARTMENT_OTHER): Payer: BC Managed Care – PPO

## 2021-10-24 ENCOUNTER — Encounter (HOSPITAL_BASED_OUTPATIENT_CLINIC_OR_DEPARTMENT_OTHER): Payer: Self-pay

## 2021-10-24 ENCOUNTER — Emergency Department (HOSPITAL_BASED_OUTPATIENT_CLINIC_OR_DEPARTMENT_OTHER)
Admission: EM | Admit: 2021-10-24 | Discharge: 2021-10-25 | Disposition: A | Discharge status: Home / Self Care | Payer: BC Managed Care – PPO | Attending: Emergency Medicine | Admitting: Emergency Medicine

## 2021-10-24 ENCOUNTER — Other Ambulatory Visit: Payer: Self-pay

## 2021-10-24 DIAGNOSIS — R200 Anesthesia of skin: Secondary | ICD-10-CM | POA: Diagnosis not present

## 2021-10-24 DIAGNOSIS — G510 Bell's palsy: Secondary | ICD-10-CM

## 2021-10-24 DIAGNOSIS — R202 Paresthesia of skin: Secondary | ICD-10-CM | POA: Diagnosis not present

## 2021-10-24 DIAGNOSIS — I639 Cerebral infarction, unspecified: Secondary | ICD-10-CM | POA: Insufficient documentation

## 2021-10-24 DIAGNOSIS — R29810 Facial weakness: Secondary | ICD-10-CM | POA: Diagnosis not present

## 2021-10-24 DIAGNOSIS — R51 Headache with orthostatic component, not elsewhere classified: Secondary | ICD-10-CM

## 2021-10-24 LAB — APTT: aPTT: 26 seconds (ref 24–36)

## 2021-10-24 LAB — DIFFERENTIAL
Abs Immature Granulocytes: 0.03 10*3/uL (ref 0.00–0.07)
Basophils Absolute: 0 10*3/uL (ref 0.0–0.1)
Basophils Relative: 1 %
Eosinophils Absolute: 0.2 10*3/uL (ref 0.0–0.5)
Eosinophils Relative: 2 %
Immature Granulocytes: 0 %
Lymphocytes Relative: 33 %
Lymphs Abs: 2.8 10*3/uL (ref 0.7–4.0)
Monocytes Absolute: 0.9 10*3/uL (ref 0.1–1.0)
Monocytes Relative: 11 %
Neutro Abs: 4.5 10*3/uL (ref 1.7–7.7)
Neutrophils Relative %: 53 %

## 2021-10-24 LAB — CBC
HCT: 42.6 % (ref 36.0–46.0)
Hemoglobin: 14.6 g/dL (ref 12.0–15.0)
MCH: 30.2 pg (ref 26.0–34.0)
MCHC: 34.3 g/dL (ref 30.0–36.0)
MCV: 88 fL (ref 80.0–100.0)
Platelets: 227 10*3/uL (ref 150–400)
RBC: 4.84 MIL/uL (ref 3.87–5.11)
RDW: 13.2 % (ref 11.5–15.5)
WBC: 8.4 10*3/uL (ref 4.0–10.5)
nRBC: 0 % (ref 0.0–0.2)

## 2021-10-24 LAB — COMPREHENSIVE METABOLIC PANEL
ALT: 17 U/L (ref 0–44)
AST: 15 U/L (ref 15–41)
Albumin: 4.2 g/dL (ref 3.5–5.0)
Alkaline Phosphatase: 55 U/L (ref 38–126)
Anion gap: 14 (ref 5–15)
BUN: 15 mg/dL (ref 6–20)
CO2: 23 mmol/L (ref 22–32)
Calcium: 9.5 mg/dL (ref 8.9–10.3)
Chloride: 103 mmol/L (ref 98–111)
Creatinine, Ser: 0.82 mg/dL (ref 0.44–1.00)
GFR, Estimated: >60 mL/min (ref >60)
Glucose, Bld: 105 mg/dL — ABNORMAL HIGH (ref 70–99)
Potassium: 3.7 mmol/L (ref 3.5–5.1)
Sodium: 140 mmol/L (ref 135–145)
Total Bilirubin: 0.3 mg/dL (ref 0.3–1.2)
Total Protein: 7.9 g/dL (ref 6.5–8.1)

## 2021-10-24 LAB — ETHANOL: Alcohol, Ethyl (B): <10 mg/dL (ref <10)

## 2021-10-24 LAB — PROTIME-INR
INR: 0.9 (ref 0.8–1.2)
Prothrombin Time: 12.4 seconds (ref 11.4–15.2)

## 2021-10-24 LAB — CBG MONITORING, ED: Glucose-Capillary: 101 mg/dL — ABNORMAL HIGH (ref 70–99)

## 2021-10-24 MED ORDER — SODIUM CHLORIDE 0.9% FLUSH
3.0000 mL | Freq: Once | INTRAVENOUS | Status: AC
  Administered 2021-10-24: 3 mL via INTRAVENOUS
  Filled 2021-10-24: qty 3

## 2021-10-24 MED ORDER — PREDNISONE 5 MG PO TABS: 50.0000 mg | ORAL_TABLET | Freq: Every day | ORAL | Status: DC

## 2021-10-24 MED ORDER — POLYVINYL ALCOHOL 1.4 % OP SOLN
1.0000 [drp] | OPHTHALMIC | Status: DC | PRN
  Filled 2021-10-24: qty 15

## 2021-10-24 MED ORDER — ASPIRIN 325 MG PO TABS
325.0000 mg | ORAL_TABLET | Freq: Every day | ORAL | Status: DC
  Administered 2021-10-24: 325 mg via ORAL
  Filled 2021-10-24: qty 1

## 2021-10-24 MED ORDER — VALACYCLOVIR HCL 500 MG PO TABS
1000.0000 mg | ORAL_TABLET | Freq: Three times a day (TID) | ORAL | Status: DC
  Administered 2021-10-25: 1000 mg via ORAL
  Filled 2021-10-24: qty 2

## 2021-10-24 MED ORDER — ARTIFICIAL TEARS OPHTHALMIC OINT: TOPICAL_OINTMENT | Freq: Every evening | OPHTHALMIC | Status: DC | PRN

## 2021-10-24 MED ORDER — IOHEXOL 350 MG/ML SOLN
100.0000 mL | Freq: Once | INTRAVENOUS | Status: AC | PRN
  Administered 2021-10-24: 75 mL via INTRAVENOUS

## 2021-10-24 NOTE — ED Notes (Signed)
Patient transported to CT 

## 2021-10-24 NOTE — ED Notes (Signed)
Stroke cart activated and Code stroke nurse assessing patient.

## 2021-10-24 NOTE — Consult Note (Signed)
NEUROLOGY TELECONSULTATION NOTE   Date of service: October 24, 2021 Patient Name: Mckenzie Maldonado MRN:  [id number] DOB:  01/19/1975 Reason for consult: telestroke  Requesting Provider: Dr. Fredia Sorrow Consult Participants: myself, bedside RN, telestroke RN, patient Location of the provider: [place], [place] Location of the patient: [facility]  This consult was provided via telemedicine with 2-way video and audio communication. The patient/family was informed that care would be provided in this way and agreed to receive care in this manner.   _ _ _   _ __   _ __ _ _  __ __   _ __   __ _  History of Present Illness   This is a 46 yo woman with hx fibroids who presents with one week positional headache and new onset L facial droop since 0900 this AM. Headache began one week ago, has fluctuated since then, not gone away. Worse when she lays down. Was not maximal at onset but built up over days. L sided, occipital and periauricular, sharp, constant. Beginning this AM she developed L facial droop, primarily lower face but also has incomplete L eye closure as well. Reports that today when she brushed her teeth toothpaste tasted funny on L side of her tongue. No other neurologic deficits. Head CT NAICP personal review. TNK not administered 2/2 presentation outside the window and concern for bell's palsy not stroke.    ROS   Per HPI; all other systems reviewed and are negative  Past History   The following was personally reviewed:  Past Medical History:  Diagnosis Date   Fibroid    Past Surgical History:  Procedure Laterality Date   FOOT SURGERY Left    Family History  Problem Relation Age of Onset   Uterine cancer Mother    Cancer Father 65       small cell, stage 4, lung, prostate and bladder   Social History   Socioeconomic History   Marital status: Married    Spouse name: Not on file   Number of children: Not on file   Years of education: Not on file   Highest education  level: Not on file  Occupational History   Not on file  Tobacco Use   Smoking status: Never   Smokeless tobacco: Never  Vaping Use   Vaping Use: Never used  Substance and Sexual Activity   Alcohol use: Yes    Alcohol/week: 0.0 standard drinks of alcohol    Comment: 1-2/week   Drug use: Never   Sexual activity: Yes    Birth control/protection: Pill  Other Topics Concern   Not on file  Social History Narrative   Not on file   Social Determinants of Health   Financial Resource Strain: Not on file  Food Insecurity: Not on file  Transportation Needs: Not on file  Physical Activity: Not on file  Stress: Not on file  Social Connections: Not on file   No Known Allergies  Medications   (Not in a hospital admission)     Current Facility-Administered Medications:    sodium chloride flush (NS) 0.9 % injection 3 mL, 3 mL, Intravenous, Once, Fredia Sorrow, MD  Current Outpatient Medications:    drospirenone-ethinyl estradiol (YAZ) 3-0.02 MG tablet, TAKE 1 TABLET BY MOUTH DAILY, Disp: 84 tablet, Rfl: 0   loratadine (CLARITIN) 10 MG tablet, Take 10 mg by mouth daily., Disp: , Rfl:   Vitals   Vitals:   10/24/21 1829 10/24/21 1841 10/24/21 1845 10/24/21 1847  BP: Marland Kitchen)  144/87 (!) 148/96 (!) 154/82   Pulse: 96 90 (!) 103   Resp: 18 (!) 31 19   Temp: 98.1 F (36.7 C)     TempSrc: Oral     SpO2: 100% 99% 100%   Weight:    73.5 kg  Height:         Body mass index is 28.7 kg/m.  Physical Exam   Exam performed over telemedicine with 2-way video and audio communication and with assistance of bedside RN  Physical Exam Gen: A&O x4, NAD Resp: normal WOB CV: extremities appear well-perfused  Neuro: *MS: A&O x4. Follows multi-step commands.  *Speech: nondysarthric, no aphasia, able to name and repeat *CN: PERRL 10m, EOMI, VFF by confrontation, sensation intact, forehead wrinkle and eyebrow raise are symmetric but L eye has incomplete closure, L facial droop, hearing  intact to voice *Motor:   Normal bulk.  No tremor, rigidity or bradykinesia. No pronator drift. All extremities appear full-strength and symmetric. *Sensory: SILT. Symmetric. No double-simultaneous extinction.  *Coordination:  Finger-to-nose, heel-to-shin, rapid alternating motions were intact. *Reflexes:  UTA 2/2 tele-exam *Gait: deferred  NIHSS = 1 for facial droop  Premorbid mRS = 0   Labs   CBC:  Recent Labs  Lab 10/24/21 1833  WBC 8.4  NEUTROABS 4.5  HGB 14.6  HCT 42.6  MCV 88.0  PLT 2876   Basic Metabolic Panel: No results found for: "NA", "K", "CO2", "GLUCOSE", "BUN", "CREATININE", "CALCIUM", "GFRNONAA", "GFRAA" Lipid Panel: No results found for: "LDLCALC" HgbA1c: No results found for: "HGBA1C" Urine Drug Screen: No results found for: "LABOPIA", "COCAINSCRNUR", "LABBENZ", "AMPHETMU", "THCU", "LABBARB"  Alcohol Level No results found for: "ETH"   Impression   This is a 46 yo woman with hx fibroids who presents with one week positional headache and new onset L facial droop since 0900 this AM. As best I can tell over video her L facial droop appears to be a L Bell's palsy given her incomplete eye closure and taste change L tongue. The headache is new onset, persistent for one week, and positional / worse when she lays down which is a red flag for possible increased intracranial pressure and requires further workup.   Recommendations   [facility] and provide updated recommendations - Cancel stroke code ______________________________________________________________________   Thank you for the opportunity to take part in the care of this patient. If you have any further questions, please contact the neurology consultation attending.  Signed,  CSu Monks MD [facility] [phone] If 7pm- 7am, please page neurology on call as listed  in [facility]  **Any copied and pasted documentation in this note was written by me in another application not billed for and pasted by me into this document.

## 2021-10-24 NOTE — ED Provider Notes (Signed)
Patient presents for MRI from outside hospital as she has developed acute onset of left-sided facial droop.  CT scan was unremarkable, she states that food is tasting differently, has a tingling feeling on the left side of her face with associated droop.  She cannot close her left eye, there is sparing of the left forehead, this is all suggestive of a Bell palsy however neurology requested patient be transferred for MRI and formal consultation.  Patient agreeable, vital signs unremarkable.  She has already ambulated to the bathroom without difficulty.  At change of shift care will be signed out to oncoming nighttime emergency physician to follow-up MRI results and consult with neurology   Noemi Chapel, MD 10/24/21 2158

## 2021-10-24 NOTE — ED Triage Notes (Signed)
Pt reports numbness in face since 0900 this morning. Pt states that lips feel tingly. Pt also reports headache since last Saturday that has persisted until now. L sided droop.

## 2021-10-24 NOTE — Consult Note (Signed)
Stroke cart activated at 18:29 Pt to CT at 18:34 Pt back from CT at 18:41 Dr Quinn Axe paged at 18:36 Dr Quinn Axe on screen to assess patient at 18:41 M[facility] 0

## 2021-10-24 NOTE — ED Provider Notes (Signed)
[facility] EMERGENCY DEPT Provider Note   CSN: [id number] Arrival date & time: 10/24/21  1818  An emergency department physician performed an initial assessment on this suspected stroke patient at 75.  History  Chief Complaint  Patient presents with   Facial [place] is a 46 y.o. female.  Patient reported some weakness and numbness and tingling feeling to her lips and left side of her tongue around 9 this morning.  Patient states she has had a headache since last Saturday.  Seen her primary care doctor for that has an MRI ordered for next week.  As the day went on she noticed her face was getting very droopy.  She thought it was secondary to taking the muscle relaxer Flexeril.  Past medical history significant just for fibroids and left foot surgery.  Patient is never used any tobacco products.       Home Medications Prior to Admission medications   Medication Sig Start Date End Date Taking? Authorizing Provider  drospirenone-ethinyl estradiol (YAZ) 3-0.02 MG tablet TAKE 1 TABLET BY MOUTH DAILY 06/04/21   Salvadore Dom, MD  loratadine (CLARITIN) 10 MG tablet Take 10 mg by mouth daily.    [provider]  Norethindrone Acetate-Ethinyl Estrad-FE (LOESTRIN 24 FE) 1-20 MG-MCG(24) tablet Take 1 tablet by mouth daily. 04/18/18 06/26/18  Salvadore Dom, MD      Allergies    Patient has no known allergies.    Review of Systems   Review of Systems  Constitutional:  Negative for chills and fever.  HENT:  Negative for ear pain and sore throat.   Eyes:  Negative for photophobia, pain and visual disturbance.  Respiratory:  Negative for cough and shortness of breath.   Cardiovascular:  Negative for chest pain and palpitations.  Gastrointestinal:  Negative for abdominal pain and vomiting.  Genitourinary:  Negative for dysuria and hematuria.  Musculoskeletal:  Negative for arthralgias and back pain.  Skin:  Negative for color change  and rash.  Neurological:  Positive for facial asymmetry and headaches. Negative for seizures and syncope.  All other systems reviewed and are negative.   Physical Exam Updated Vital Signs BP 129/82   Pulse 82   Temp 98.1 F (36.7 C) (Oral)   Resp 19   Ht 1.6 m ('5\' 3"'$ )   Wt 73.5 kg   LMP 10/10/2021 (Approximate)   SpO2 100%   BMI 28.70 kg/m  Physical Exam Vitals and nursing note reviewed.  Constitutional:      General: She is not in acute distress.    Appearance: Normal appearance. She is well-developed.  HENT:     Head: Normocephalic and atraumatic.  Eyes:     Extraocular Movements: Extraocular movements intact.     Conjunctiva/sclera: Conjunctivae normal.     Pupils: Pupils are equal, round, and reactive to light.  Cardiovascular:     Rate and Rhythm: Normal rate and regular rhythm.     Heart sounds: No murmur heard. Pulmonary:     Effort: Pulmonary effort is normal. No respiratory distress.     Breath sounds: Normal breath sounds.  Abdominal:     Palpations: Abdomen is soft.     Tenderness: There is no abdominal tenderness.  Musculoskeletal:        General: No swelling.     Cervical back: Normal range of motion and neck supple. No rigidity.  Skin:    General: Skin is warm and dry.     Capillary Refill:  Capillary refill takes less than 2 seconds.  Neurological:     Mental Status: She is alert and oriented to person, place, and time.     Cranial Nerves: Cranial nerve deficit present.  Psychiatric:        Mood and Affect: Mood normal.     ED Results / Procedures / Treatments   Labs (all labs ordered are listed, but only abnormal results are displayed) Labs Reviewed  COMPREHENSIVE METABOLIC PANEL - Abnormal; Notable for the following components:      Result Value   Glucose, Bld 105 (*)    All other components within normal limits  CBG MONITORING, ED - Abnormal; Notable for the following components:   Glucose-Capillary 101 (*)    All other components within  normal limits  PROTIME-INR  APTT  CBC  DIFFERENTIAL  ETHANOL  PREGNANCY, URINE    EKG EKG Interpretation  Date/Time:  Saturday October 24 2021 18:25:17 EDT Ventricular Rate:  95 PR Interval:  128 QRS Duration: 84 QT Interval:  366 QTC Calculation: 459 R Axis:   -32 Text Interpretation: Normal sinus rhythm Left axis deviation Moderate voltage criteria for LVH, may be normal variant ( R in aVL , Cornell product ) Anterolateral infarct , age undetermined Abnormal ECG No previous ECGs available Confirmed by Fredia Sorrow [phone]) on 10/24/2021 6:43:06 PM  Radiology CT ANGIO HEAD NECK W WO CM  Result Date: 10/24/2021 CLINICAL DATA:  Facial numbness EXAM: CT ANGIOGRAPHY HEAD AND NECK TECHNIQUE: Multidetector CT imaging of the head and neck was performed using the standard protocol during bolus administration of intravenous contrast. Multiplanar CT image reconstructions and MIPs were obtained to evaluate the vascular anatomy. Carotid stenosis measurements (when applicable) are obtained utilizing NASCET criteria, using the distal internal carotid diameter as the denominator. RADIATION DOSE REDUCTION: This exam was performed according to the departmental dose-optimization program which includes automated exposure control, adjustment of the mA and/or kV according to patient size and/or use of iterative reconstruction technique. CONTRAST:  66m OMNIPAQUE IOHEXOL 350 MG/ML SOLN COMPARISON:  None Available. FINDINGS: CTA NECK FINDINGS SKELETON: There is no bony spinal canal stenosis. No lytic or blastic lesion. OTHER NECK: Normal pharynx, larynx and major salivary glands. No cervical lymphadenopathy. Unremarkable thyroid gland. UPPER CHEST: No pneumothorax or pleural effusion. No nodules or masses. AORTIC ARCH: There is no calcific atherosclerosis of the aortic arch. There is no aneurysm, dissection or hemodynamically significant stenosis of the visualized portion of the aorta. Conventional 3 vessel  aortic branching pattern. The visualized proximal subclavian arteries are widely patent. RIGHT CAROTID SYSTEM: Normal without aneurysm, dissection or stenosis. LEFT CAROTID SYSTEM: Normal without aneurysm, dissection or stenosis. VERTEBRAL ARTERIES: Left dominant configuration. Both origins are clearly patent. There is no dissection, occlusion or flow-limiting stenosis to the skull base (V1-V3 segments). CTA HEAD FINDINGS POSTERIOR CIRCULATION: --Vertebral arteries: Normal V4 segments. --Inferior cerebellar arteries: Normal. --Basilar artery: Normal. --Superior cerebellar arteries: Normal. --Posterior cerebral arteries (PCA): Normal. ANTERIOR CIRCULATION: --Intracranial internal carotid arteries: Normal. --Anterior cerebral arteries (ACA): Normal. Both A1 segments are present. Patent anterior communicating artery (a-comm). --Middle cerebral arteries (MCA): Normal. VENOUS SINUSES: As permitted by contrast timing, patent. ANATOMIC VARIANTS: None Review of the MIP images confirms the above findings. IMPRESSION: Normal CTA of the head and neck. Electronically Signed   By: KUlyses JarredM.D.   On: 10/24/2021 20:08   CT HEAD WO CONTRAST (5MM)  Result Date: 10/24/2021 CLINICAL DATA:  Neuro deficit, acute stroke suspected. Numbness in face since 9 a.m.  this morning. EXAM: CT HEAD WITHOUT CONTRAST TECHNIQUE: Contiguous axial images were obtained from the base of the skull through the vertex without intravenous contrast. RADIATION DOSE REDUCTION: This exam was performed according to the departmental dose-optimization program which includes automated exposure control, adjustment of the mA and/or kV according to patient size and/or use of iterative reconstruction technique. COMPARISON:  None Available. FINDINGS: Brain: No evidence of acute infarction, hemorrhage, hydrocephalus, extra-axial collection or mass lesion/mass effect. Vascular: No hyperdense vessel or unexpected calcification. Skull: Normal. Negative for fracture  or focal lesion. Sinuses/Orbits: No acute finding. Other: None. IMPRESSION: No acute intracranial pathology. Electronically Signed   By: Keane Police D.O.   On: 10/24/2021 18:45    Procedures Procedures    Medications Ordered in ED Medications  sodium chloride flush (NS) 0.9 % injection 3 mL (3 mLs Intravenous Given 10/24/21 1915)  iohexol (OMNIPAQUE) 350 MG/ML injection 100 mL (75 mLs Intravenous Contrast Given 10/24/21 1946)    ED Course/ Medical Decision Making/ A&P                           Medical Decision Making Amount and/or Complexity of Data Reviewed Labs: ordered. Radiology: ordered.  Risk Prescription drug management.   Patient had stroke order set.  Head CT without any acute findings.  Patient seen by Dr. Quinn Axe from teleneurology.  I was concerned based on the fact that there is some increased movement of the left forehead area so bit of mixed picture for true Bell's palsy but most likely Bell's palsy.  And then with a 1 week history of the headache they wanted to get CT angio head and neck which have ordered for here and then transfer into [facility] for MRI.  That will be ordered.  Discussed with Dr. Sherwood Gambler who is excepting the patient for ED to ED transfer.  Dr. Quinn Axe is having her neurology colleagues see the patient in person when she gets there.  So we need to call them to make sure they see her. Patient's blood pressure here elevated a little bit 144/87 oxygen saturations 100% no fever.  CBC no leukocytosis hemoglobin 14.6.  Complete metabolic panel including liver function tests and renal function is all normal.  Patient's blood sugar initially when she came in was 101 CT angio head neck with and without normal CT angio head and neck.  CT head normal no intracranial pathology.  EKG without any acute findings.  We still need a negative pregnancy test on her.  Patient will be transferred to [facility] for MRI I will order the MRI.  Patient will go by [facility].  CRITICAL  CARE Performed by: Fredia Sorrow Total critical care time: 45 minutes Critical care time was exclusive of separately billable procedures and treating other patients. Critical care was necessary to treat or prevent imminent or life-threatening deterioration. Critical care was time spent personally by me on the following activities: development of treatment plan with patient and/or surrogate as well as nursing, discussions with consultants, evaluation of patient's response to treatment, examination of patient, obtaining history from patient or surrogate, ordering and performing treatments and interventions, ordering and review of laboratory studies, ordering and review of radiographic studies, pulse oximetry and re-evaluation of patient's condition.   Final Clinical Impression(s) / ED Diagnoses Final diagnoses:  Cerebrovascular accident (CVA), unspecified mechanism ([place])    Rx / DC Orders ED Discharge Orders     None  Fredia Sorrow, MD 10/24/21 2033

## 2021-10-24 NOTE — ED Notes (Signed)
The pt was  brooooooouught here by [facility] from [place]  code stroke was called there her neuro studies were normal there  lt facial droop  speech clear headache   she was given aspirin there with a cup of water  she reports that she did not receive a swallow test there prior to the aspirin being given

## 2021-10-24 NOTE — Progress Notes (Signed)
Brief Neuro Update:  No charge note. Please see Dr. Artemio Aly initial note for full history.  Briefly, Ms Mckenzie Maldonado is a 60F code stroke at [facility] for L upper and lower facial droop + 1 week of headache with features of elevated pressure.  Was asked to evaluate her here given tele neuro exam is limited for evaluation of bells palsy. Exam here is consistent with a lower motor neuron facial palsy with incomplete eye closure on the left, mildly decreased forehead wrinkling on the left, flattening of the nasolabial fold on the left and drooping of the angle of the mouth on the left side. No ear pain, fullness or pressure. No mastoid tenderness. No tenderness to palpation of the temple or when pulling the pinna.  No ear vesicles concerning for [place], no tongue fissuring concerning for melkerson-rosenthal syndrome. Endorses going to [place] in august.  Plan: - agree with getting MRI Brain with and without contrast per Dr. Livia Snellen recommendations given headache with features of high pressure. - Valacyclovir '1000mg'$  TID x 7 days - Prednisone '50mg'$  daily x 7 days - Lyme PCR serum given recent trip to [place]. PCP will need to follow up on this. - Lubricating eye drops as needed - ointment formulation or artificial tears at bedtime - Tape eye shut overnight with a papertape to prevent dryness - Follow up with PCP within 1 week for close monitoring of eye closure. If this does not improve, recommend STAT referral to ophthalmology.  [place]a Pager Number [phone]

## 2021-10-25 ENCOUNTER — Other Ambulatory Visit (HOSPITAL_COMMUNITY): Payer: Self-pay | Admitting: Radiology

## 2021-10-25 ENCOUNTER — Other Ambulatory Visit: Payer: Self-pay | Admitting: Neurology

## 2021-10-25 ENCOUNTER — Emergency Department (HOSPITAL_COMMUNITY): Payer: BC Managed Care – PPO

## 2021-10-25 ENCOUNTER — Telehealth: Payer: Self-pay

## 2021-10-25 DIAGNOSIS — R29810 Facial weakness: Secondary | ICD-10-CM | POA: Diagnosis not present

## 2021-10-25 DIAGNOSIS — G510 Bell's palsy: Secondary | ICD-10-CM

## 2021-10-25 MED ORDER — PREDNISONE 20 MG PO TABS: ORAL_TABLET | ORAL | 0 refills | Status: DC

## 2021-10-25 MED ORDER — PREDNISONE 5 MG PO TABS
50.0000 mg | ORAL_TABLET | Freq: Every day | ORAL | Status: DC
  Administered 2021-10-25: 50 mg via ORAL
  Filled 2021-10-25: qty 2

## 2021-10-25 MED ORDER — CARBOXYMETHYLCELLULOSE SODIUM 0.5 % OP SOLN: 1.0000 [drp] | OPHTHALMIC | 2 refills | Status: DC | PRN

## 2021-10-25 MED ORDER — ARTIFICIAL TEARS OPHTHALMIC OINT: TOPICAL_OINTMENT | Freq: Every evening | OPHTHALMIC | 2 refills | Status: DC | PRN

## 2021-10-25 MED ORDER — GADOBUTROL 1 MMOL/ML IV SOLN
7.5000 mL | Freq: Once | INTRAVENOUS | Status: AC | PRN
  Administered 2021-10-25: 7.5 mL via INTRAVENOUS

## 2021-10-25 MED ORDER — VALACYCLOVIR HCL 1 G PO TABS: 1000.0000 mg | ORAL_TABLET | Freq: Three times a day (TID) | ORAL | 0 refills | Status: DC

## 2021-10-25 NOTE — ED Provider Notes (Signed)
1:33 AM Assumed care from Dr. Sabra Heck, please see their note for full history, physical and decision making until this point. In brief this is a 46 y.o. year old female who presented to the ED tonight with Facial Droop     Neuro seen. Recommendations enacted.   MRI independently reviewed by myself without obvious stroke. Reviewed radiology report.   Will do prednisone/valacyclovir and eye precautisons per neuro recs. PCP follow up.  Discharge instructions, including strict return precautions for new or worsening symptoms, given. Patient and/or family verbalized understanding and agreement with the plan as described.   Labs, studies and imaging reviewed by myself and considered in medical decision making if ordered. Imaging interpreted by radiology.  Labs Reviewed  COMPREHENSIVE METABOLIC PANEL - Abnormal; Notable for the following components:      Result Value   Glucose, Bld 105 (*)    All other components within normal limits  CBG MONITORING, ED - Abnormal; Notable for the following components:   Glucose-Capillary 101 (*)    All other components within normal limits  PROTIME-INR  APTT  CBC  DIFFERENTIAL  ETHANOL  PREGNANCY, URINE  LYME DISEASE DNA BY PCR(BORRELIA BURG)    MR Brain W and Wo Contrast  Final Result    CT ANGIO HEAD NECK W WO CM  Final Result    CT HEAD WO CONTRAST (5MM)  Final Result      No follow-ups on file. Harold Barban, Corene Cornea, MD 10/25/21 [phone]

## 2021-10-25 NOTE — Discharge Instructions (Addendum)
Neurology Recommendations - Valacyclovir '1000mg'$  TID x 7 days - Prednisone '50mg'$  daily x 7 days - Lyme PCR serum given recent trip to [place]. PCP will need to follow up on this. - Lubricating eye drops as needed - ointment formulation or artificial tears at bedtime - Tape eye shut overnight with a papertape to prevent dryness - Follow up with PCP within 1 week for close monitoring of eye closure. If this does not improve, recommend STAT referral to ophthalmology.

## 2021-10-25 NOTE — Telephone Encounter (Signed)
Patient called in concerned that eye ointment was stressed by MD, yet no script given. There is a notation for wither ointment or refresh eye drops, and she did have a script for refresh. Messaged Neurology to make sure that ointment not needed as well.

## 2021-10-25 NOTE — Progress Notes (Unsigned)
Sent a script for eye ointment to use PRN at bedtime for L bells palsy with incomplete eye closure.  [place] Pager Number [phone]

## 2021-10-28 ENCOUNTER — Other Ambulatory Visit: Payer: BC Managed Care – PPO

## 2021-10-29 DIAGNOSIS — R519 Headache, unspecified: Secondary | ICD-10-CM | POA: Diagnosis not present

## 2021-10-29 DIAGNOSIS — G510 Bell's palsy: Secondary | ICD-10-CM | POA: Diagnosis not present

## 2021-11-02 ENCOUNTER — Inpatient Hospital Stay: Admission: RE | Admit: 2021-11-02 | Payer: BC Managed Care – PPO | Source: Ambulatory Visit

## 2021-11-02 NOTE — Progress Notes (Signed)
46 y.o. G0P0000 Married White or Caucasian Not Hispanic or Latino female here for annual exam.  She is on Yaz, has minimal spotting, then has a light cycle for a week, then spots for another weeks. She is bleeding for ~2 weeks a month. On her heaviest day she can saturate a regular tampon in 4 hours. The spotting week, she just notices it when she wipes. 1 day of mild cramping.   Sexually active, occasional deep dyspareunia, positionable and tolerable.   No bowel or bladder issues. Urinates frequently, normal amounts, no change.     Patient's last menstrual period was 10/10/2021 (approximate).          Sexually active: Yes.    The current method of family planning is OCP (estrogen/progesterone).    Exercising: Yes.     Running and strength training  Smoker:  no  Health Maintenance: Pap:  02-21-18 normal, neg HR HPV: 07-20-16 negative, HR HPV negative History of abnormal Pap:  no MMG:  12/17/20 tomo Bi-rads 3 probably benign [place] preformed with same results.  BMD:   n/a Colonoscopy: n/a TDaP:  UTD per patient  Gardasil: none   reports that she has never smoked. She has never used smokeless tobacco. She reports current alcohol use. She reports that she does not use drugs. Occasional ETOH. Works in Engineer, technical sales, works from home.  Past Medical History:  Diagnosis Date   Fibroid     Past Surgical History:  Procedure Laterality Date   FOOT SURGERY Left     Current Outpatient Medications  Medication Sig Dispense Refill   artificial tears (LACRILUBE) OINT ophthalmic ointment Place into both eyes at bedtime as needed for dry eyes. 30 g 2   carboxymethylcellulose (REFRESH PLUS) 0.5 % SOLN Place 1 drop into the left eye as needed. 30 mL 2   drospirenone-ethinyl estradiol (YAZ) 3-0.02 MG tablet TAKE 1 TABLET BY MOUTH DAILY 84 tablet 0   loratadine (CLARITIN) 10 MG tablet Take 10 mg by mouth daily.     Pseudoephedrine HCl (SUDAFED 12 HOUR PO) Take by mouth.     No current facility-administered  medications for this visit.    Family History  Problem Relation Age of Onset   Uterine cancer Mother    Cancer Father 61       small cell, stage 4, lung, prostate and bladder    Review of Systems  All other systems reviewed and are negative.   Exam:   BP 122/74   Pulse 80   Ht 5' 1.81" (1.57 m)   Wt 155 lb (70.3 kg)   LMP 10/10/2021 (Approximate)   SpO2 99%   BMI 28.52 kg/m   Weight change: '@WEIGHTCHANGE'$ @ Height:   Height: 5' 1.81" (157 cm)  Ht Readings from Last 3 Encounters:  11/03/21 5' 1.81" (1.57 m)  10/24/21 '5\' 3"'$  (1.6 m)  09/24/20 '5\' 3"'$  (1.6 m)    General appearance: alert, cooperative and appears stated age Head: Normocephalic, without obvious abnormality, atraumatic Neck: no adenopathy, supple, symmetrical, trachea midline and thyroid normal to inspection and palpation Lungs: clear to auscultation bilaterally Cardiovascular: regular rate and rhythm Breasts: normal appearance, no masses or tenderness Abdomen: soft, non-tender; non distended,  no masses,  no organomegaly Extremities: extremities normal, atraumatic, no cyanosis or edema Skin: Skin color, texture, turgor normal. No rashes or lesions Lymph nodes: Cervical, supraclavicular, and axillary nodes normal. No abnormal inguinal nodes palpated Neurologic: Grossly normal   Pelvic: External genitalia:  no lesions  Urethra:  normal appearing urethra with no masses, tenderness or lesions              Bartholins and Skenes: normal                 Vagina: normal appearing vagina with normal color and discharge, no lesions              Cervix: no lesions               Bimanual Exam:  Uterus:   normal sized, slightly irregular, retroverted, non tender uterus (known fibroids)              Adnexa: no mass, fullness, tenderness               Rectovaginal: Confirms               Anus:  normal sphincter tone, no lesions  Gae Dry, CMA chaperoned for the exam.  1. Well woman exam Discussed breast  self exam Discussed calcium and vit D intake Mammogram in 12/23 Labs UTD No pap this year  2. Encounter for surveillance of contraceptive pills Having breaking bleeding with Yaz, otherwise feels well on it. Will try yasmin - drospirenone-ethinyl estradiol (YASMIN) 3-0.03 MG tablet; Take 1 tablet by mouth daily.  Dispense: 84 tablet; Refill: 3  3. Breakthrough bleeding on birth control pills Will try increasing her dose of OCP's. If she continues to have BTB, consider u/s (known fibroids)  Addendum: she is struggling with weight loss, exercises 3 days a week, eats a healthy diet. Discussed trying weight watchers, increasing exercise. She reports normal TSH with her primary

## 2021-11-03 ENCOUNTER — Encounter: Payer: Self-pay | Admitting: Obstetrics and Gynecology

## 2021-11-03 ENCOUNTER — Ambulatory Visit (INDEPENDENT_AMBULATORY_CARE_PROVIDER_SITE_OTHER): Payer: BC Managed Care – PPO | Admitting: Obstetrics and Gynecology

## 2021-11-03 VITALS — BP 122/74 | HR 80 | Ht 61.81 in | Wt 155.0 lb

## 2021-11-03 DIAGNOSIS — Z01419 Encounter for gynecological examination (general) (routine) without abnormal findings: Secondary | ICD-10-CM | POA: Diagnosis not present

## 2021-11-03 DIAGNOSIS — N921 Excessive and frequent menstruation with irregular cycle: Secondary | ICD-10-CM | POA: Diagnosis not present

## 2021-11-03 DIAGNOSIS — Z3041 Encounter for surveillance of contraceptive pills: Secondary | ICD-10-CM

## 2021-11-03 MED ORDER — DROSPIRENONE-ETHINYL ESTRADIOL 3-0.03 MG PO TABS: 1.0000 | ORAL_TABLET | Freq: Every day | ORAL | 3 refills | Status: DC

## 2021-11-03 NOTE — Patient Instructions (Signed)

## 2021-12-20 DIAGNOSIS — J069 Acute upper respiratory infection, unspecified: Secondary | ICD-10-CM | POA: Diagnosis not present

## 2021-12-20 DIAGNOSIS — R6883 Chills (without fever): Secondary | ICD-10-CM | POA: Diagnosis not present

## 2021-12-20 DIAGNOSIS — R051 Acute cough: Secondary | ICD-10-CM | POA: Diagnosis not present

## 2021-12-20 DIAGNOSIS — M791 Myalgia, unspecified site: Secondary | ICD-10-CM | POA: Diagnosis not present

## 2021-12-30 ENCOUNTER — Ambulatory Visit
Admission: RE | Admit: 2021-12-30 | Discharge: 2021-12-30 | Disposition: A | Discharge status: Home / Self Care | Payer: BC Managed Care – PPO | Source: Ambulatory Visit | Attending: Family Medicine | Admitting: Family Medicine

## 2021-12-30 DIAGNOSIS — N6314 Unspecified lump in the right breast, lower inner quadrant: Secondary | ICD-10-CM | POA: Diagnosis not present

## 2021-12-30 DIAGNOSIS — N6310 Unspecified lump in the right breast, unspecified quadrant: Secondary | ICD-10-CM

## 2021-12-30 DIAGNOSIS — N6320 Unspecified lump in the left breast, unspecified quadrant: Secondary | ICD-10-CM

## 2021-12-30 DIAGNOSIS — R922 Inconclusive mammogram: Secondary | ICD-10-CM | POA: Diagnosis not present

## 2021-12-30 DIAGNOSIS — N6489 Other specified disorders of breast: Secondary | ICD-10-CM | POA: Diagnosis not present

## 2022-01-19 DIAGNOSIS — Z713 Dietary counseling and surveillance: Secondary | ICD-10-CM | POA: Diagnosis not present

## 2022-01-19 DIAGNOSIS — Z6830 Body mass index (BMI) 30.0-30.9, adult: Secondary | ICD-10-CM | POA: Diagnosis not present

## 2022-04-02 DIAGNOSIS — H35371 Puckering of macula, right eye: Secondary | ICD-10-CM | POA: Diagnosis not present

## 2022-04-02 DIAGNOSIS — H43823 Vitreomacular adhesion, bilateral: Secondary | ICD-10-CM | POA: Diagnosis not present

## 2022-04-02 DIAGNOSIS — H33321 Round hole, right eye: Secondary | ICD-10-CM | POA: Diagnosis not present

## 2022-04-20 DIAGNOSIS — E669 Obesity, unspecified: Secondary | ICD-10-CM | POA: Diagnosis not present

## 2022-04-20 DIAGNOSIS — Z6827 Body mass index (BMI) 27.0-27.9, adult: Secondary | ICD-10-CM | POA: Diagnosis not present

## 2022-06-29 ENCOUNTER — Other Ambulatory Visit: Payer: Self-pay | Admitting: Obstetrics and Gynecology

## 2022-06-29 DIAGNOSIS — Z3041 Encounter for surveillance of contraceptive pills: Secondary | ICD-10-CM

## 2022-06-29 NOTE — Telephone Encounter (Signed)
In 10/24 a 3 month supply of OCP's with 3 refills was sent. She isn't due for a refill until 10/24.

## 2022-06-29 NOTE — Telephone Encounter (Signed)
Med refill request: Mckenzie Maldonado Last AEX: 11/03/21 Next AEX: not scheduled Last MMG (if hormonal med) 12/30/21 Refill authorized: Please Advise, requesting #[phone] advanced fill, no more refills left

## 2022-09-01 ENCOUNTER — Other Ambulatory Visit: Payer: Self-pay

## 2022-09-01 DIAGNOSIS — Z3041 Encounter for surveillance of contraceptive pills: Secondary | ICD-10-CM

## 2022-09-01 MED ORDER — DROSPIRENONE-ETHINYL ESTRADIOL 3-0.03 MG PO TABS: 1.0000 | ORAL_TABLET | Freq: Every day | ORAL | 0 refills | Status: DC

## 2022-09-01 NOTE — Telephone Encounter (Signed)
Pt LVM in triage box requesting refill since now scheduled for AEX.  Med refill request: [facility] Last AEX: 11/03/2021 Next AEX: 11/10/2022  Last MMG (if hormonal med): 12/30/2021 Refill authorized: rx pend.

## 2022-11-10 ENCOUNTER — Ambulatory Visit (INDEPENDENT_AMBULATORY_CARE_PROVIDER_SITE_OTHER): Payer: BC Managed Care – PPO | Admitting: Obstetrics and Gynecology

## 2022-11-10 ENCOUNTER — Encounter: Payer: Self-pay | Admitting: Obstetrics and Gynecology

## 2022-11-10 ENCOUNTER — Other Ambulatory Visit (HOSPITAL_COMMUNITY)
Admission: RE | Admit: 2022-11-10 | Discharge: 2022-11-10 | Disposition: A | Discharge status: Home / Self Care | Payer: BC Managed Care – PPO | Source: Ambulatory Visit | Attending: Obstetrics and Gynecology | Admitting: Obstetrics and Gynecology

## 2022-11-10 VITALS — BP 114/70 | HR 92 | Ht 61.25 in | Wt 153.0 lb

## 2022-11-10 DIAGNOSIS — Z3041 Encounter for surveillance of contraceptive pills: Secondary | ICD-10-CM | POA: Diagnosis not present

## 2022-11-10 DIAGNOSIS — Z01419 Encounter for gynecological examination (general) (routine) without abnormal findings: Secondary | ICD-10-CM | POA: Insufficient documentation

## 2022-11-10 DIAGNOSIS — D219 Benign neoplasm of connective and other soft tissue, unspecified: Secondary | ICD-10-CM

## 2022-11-10 DIAGNOSIS — R5383 Other fatigue: Secondary | ICD-10-CM | POA: Diagnosis not present

## 2022-11-10 DIAGNOSIS — Z1211 Encounter for screening for malignant neoplasm of colon: Secondary | ICD-10-CM

## 2022-11-10 DIAGNOSIS — E559 Vitamin D deficiency, unspecified: Secondary | ICD-10-CM | POA: Diagnosis not present

## 2022-11-10 DIAGNOSIS — R7303 Prediabetes: Secondary | ICD-10-CM | POA: Diagnosis not present

## 2022-11-10 DIAGNOSIS — N951 Menopausal and female climacteric states: Secondary | ICD-10-CM

## 2022-11-10 DIAGNOSIS — R61 Generalized hyperhidrosis: Secondary | ICD-10-CM | POA: Diagnosis not present

## 2022-11-10 MED ORDER — DROSPIRENONE-ETHINYL ESTRADIOL 3-0.03 MG PO TABS: 1.0000 | ORAL_TABLET | Freq: Every day | ORAL | 0 refills | Status: DC

## 2022-11-10 NOTE — Progress Notes (Signed)
47 y.o. y.o. female here for annual exam.  Patient's last menstrual period was 10/31/2022. Period Duration (Days):  (varies) Period Pattern: Regular Menstrual Flow: Heavy, Moderate (heavy for 2 days) Menstrual Control: Tampon Dysmenorrhea: (!) Mild Dysmenorrhea Symptoms: Cramping  Some spotting noted 4-5 days after cycle. 1-2 days of heavy bleeding History of fibroids C/o menopausal symptoms and disruption in her sleep and weight gain  Patient's last menstrual period was 10/10/2021 (approximate).          Sexually active: Yes.    The current method of family planning is OCP (estrogen/progesterone).    Exercising: Yes.    Running and strength training  Smoker:  no  Health Maintenance: Pap:  02-21-18 normal, neg HR HPV: 07-20-16 negative, HR HPV negative History of abnormal Pap:  no MMG:  12/17/20 tomo Bi-rads 3 probably benign [place] preformed with same results. 12/23 with US-stable masses BMD:   n/a Colonoscopy: n/a-desires cologuard. No family history of cancer. Order placed for age 60 screening TDaP:  UTD per patient  Gardasil: none Ocps" yasmin and pleased with it  Height 5' 1.25" (1.556 m), weight 153 lb (69.4 kg), last menstrual period 10/31/2022.     Component Value Date/Time   DIAGPAP  02/21/2018 0000    NEGATIVE FOR INTRAEPITHELIAL LESIONS OR MALIGNANCY.   DIAGPAP  07/20/2016 0000    NEGATIVE FOR INTRAEPITHELIAL LESIONS OR MALIGNANCY.   ADEQPAP  02/21/2018 0000    Satisfactory for evaluation  endocervical/transformation zone component PRESENT.   ADEQPAP  07/20/2016 0000    Satisfactory for evaluation  endocervical/transformation zone component ABSENT.    GYN HISTORY:    Component Value Date/Time   DIAGPAP  02/21/2018 0000    NEGATIVE FOR INTRAEPITHELIAL LESIONS OR MALIGNANCY.   DIAGPAP  07/20/2016 0000    NEGATIVE FOR INTRAEPITHELIAL LESIONS OR MALIGNANCY.   ADEQPAP  02/21/2018 0000    Satisfactory for evaluation  endocervical/transformation zone component  PRESENT.   ADEQPAP  07/20/2016 0000    Satisfactory for evaluation  endocervical/transformation zone component ABSENT.    OB History  Gravida Para Term Preterm AB Living  0 0 0 0 0 0  SAB IAB Ectopic Multiple Live Births  0 0 0 0 0    Past Medical History:  Diagnosis Date   Fibroid     Past Surgical History:  Procedure Laterality Date   FOOT SURGERY Left     Current Outpatient Medications on File Prior to Visit  Medication Sig Dispense Refill   artificial tears (LACRILUBE) OINT ophthalmic ointment Place into both eyes at bedtime as needed for dry eyes. 30 g 2   carboxymethylcellulose (REFRESH PLUS) 0.5 % SOLN Place 1 drop into the left eye as needed. 30 mL 2   drospirenone-ethinyl estradiol (YASMIN) 3-0.03 MG tablet Take 1 tablet by mouth daily. 84 tablet 0   loratadine (CLARITIN) 10 MG tablet Take 10 mg by mouth daily.     Pseudoephedrine HCl (SUDAFED 12 HOUR PO) Take by mouth.     [DISCONTINUED] Norethindrone Acetate-Ethinyl Estrad-FE (LOESTRIN 24 FE) 1-20 MG-MCG(24) tablet Take 1 tablet by mouth daily. 3 Package 0   No current facility-administered medications on file prior to visit.    Social History   Socioeconomic History   Marital status: Married    Spouse name: Not on file   Number of children: Not on file   Years of education: Not on file   Highest education level: Not on file  Occupational History   Not on file  Tobacco Use   Smoking status: Never   Smokeless tobacco: Never  Vaping Use   Vaping status: Never Used  Substance and Sexual Activity   Alcohol use: Yes    Alcohol/week: 0.0 standard drinks of alcohol    Comment: 1-2/week   Drug use: Never   Sexual activity: Yes    Birth control/protection: Pill  Other Topics Concern   Not on file  Social History Narrative   Not on file   Social Determinants of Health   Financial Resource Strain: Not on file  Food Insecurity: Not on file  Transportation Needs: Not on file  Physical Activity: Not on  file  Stress: Not on file  Social Connections: Not on file  Intimate Partner Violence: Not on file    Family History  Problem Relation Age of Onset   Uterine cancer Mother    Cancer Father 54       small cell, stage 4, lung, prostate and bladder     No Known Allergies    Patient's last menstrual period was Patient's last menstrual period was 10/31/2022.Marland Kitchen            Review of Systems Alls systems reviewed and are negative.     Physical Exam Constitutional:      Appearance: Normal appearance.  Genitourinary:     Vulva and urethral meatus normal.     No lesions in the vagina.     Genitourinary Comments: Possible fibroids     Right Labia: No rash, lesions or skin changes.    Left Labia: No lesions, skin changes or rash.    No vaginal discharge or tenderness.     No vaginal prolapse present.    No vaginal atrophy present.     Right Adnexa: not tender, not palpable and no mass present.    Left Adnexa: not tender, not palpable and no mass present.    No cervical motion tenderness or discharge.     Uterus is enlarged.     Uterus is not tender or irregular.     Uterus is anteverted.  Breasts:    Right: Normal.     Left: Normal.  HENT:     Head: Normocephalic.  Neck:     Thyroid: No thyroid mass, thyromegaly or thyroid tenderness.  Cardiovascular:     Rate and Rhythm: Normal rate and regular rhythm.     Heart sounds: Normal heart sounds, S1 normal and S2 normal.  Pulmonary:     Effort: Pulmonary effort is normal.     Breath sounds: Normal breath sounds and air entry.  Abdominal:     General: There is no distension.     Palpations: Abdomen is soft. There is no mass.     Tenderness: There is no abdominal tenderness. There is no guarding or rebound.  Musculoskeletal:        General: Normal range of motion.     Cervical back: Full passive range of motion without pain, normal range of motion and neck supple. No tenderness.     Right lower leg: No edema.     Left  lower leg: No edema.  Neurological:     Mental Status: She is alert.  Skin:    General: Skin is warm.  Psychiatric:        Mood and Affect: Mood normal.        Behavior: Behavior normal.        Thought Content: Thought content normal.  Vitals and nursing note reviewed. Exam conducted  with a chaperone present.       A:         Well Woman GYN exam, menopausal symptoms, h/o fbroids                             P:        Pap smear collected today Encouraged annual mammogram screening Colon cancer screening referral placed today DXA not indicated Labs and immunizations ordered today Discussed breast self exams Encouraged healthy lifestyle practices Hormone panel placed with TSH Discussed continuous use of ocp's To get TV US to evaluate for fibroids  No follow-ups on file.  Earley Favor

## 2022-11-12 ENCOUNTER — Encounter: Payer: Self-pay | Admitting: Obstetrics and Gynecology

## 2022-11-12 DIAGNOSIS — Z01419 Encounter for gynecological examination (general) (routine) without abnormal findings: Secondary | ICD-10-CM

## 2022-11-12 DIAGNOSIS — Z3041 Encounter for surveillance of contraceptive pills: Secondary | ICD-10-CM

## 2022-11-12 DIAGNOSIS — N921 Excessive and frequent menstruation with irregular cycle: Secondary | ICD-10-CM

## 2022-11-12 DIAGNOSIS — D219 Benign neoplasm of connective and other soft tissue, unspecified: Secondary | ICD-10-CM

## 2022-11-12 LAB — CYTOLOGY - PAP
Comment: NEGATIVE
Diagnosis: NEGATIVE
High risk HPV: NEGATIVE

## 2022-11-15 MED ORDER — DROSPIRENONE-ETHINYL ESTRADIOL 3-0.03 MG PO TABS: 1.0000 | ORAL_TABLET | Freq: Every day | ORAL | 2 refills | Status: DC

## 2022-11-15 NOTE — Telephone Encounter (Signed)
Routing to provider for final review.

## 2022-11-15 NOTE — Telephone Encounter (Signed)
Melton Alar, CMA Left message for patient to call and schedule appointment.

## 2022-11-15 NOTE — Addendum Note (Signed)
Addended by: Jodelle Red D on: 11/15/2022 10:17 AM   Modules accepted: Orders

## 2022-11-17 LAB — TESTOS,TOTAL,FREE AND SHBG (FEMALE)
Free Testosterone: 1.5 pg/mL (ref 0.1–6.4)
Sex Hormone Binding: 116.3 nmol/L (ref 17–124)
Testosterone, Total, LC-MS-MS: 20 ng/dL (ref 2–45)

## 2022-11-17 LAB — TSH: TSH: 3.05 m[IU]/L

## 2022-11-17 LAB — FOLLICLE STIMULATING HORMONE: FSH: 2.6 m[IU]/mL

## 2022-11-17 LAB — VITAMIN D 25 HYDROXY (VIT D DEFICIENCY, FRACTURES): Vit D, 25-Hydroxy: 28 ng/mL — ABNORMAL LOW (ref 30–100)

## 2022-11-17 LAB — ESTRADIOL: Estradiol: 16 pg/mL

## 2022-12-16 ENCOUNTER — Other Ambulatory Visit: Payer: BC Managed Care – PPO

## 2022-12-16 ENCOUNTER — Telehealth: Payer: Self-pay

## 2022-12-16 ENCOUNTER — Other Ambulatory Visit: Payer: BC Managed Care – PPO | Admitting: Obstetrics and Gynecology

## 2022-12-16 NOTE — Telephone Encounter (Addendum)
Please advise next recommendations  ----- Message from CMA Alora B sent at 12/16/2022  3:50 PM EST ----- Patient came in late to [place] appointment, [place] tech stated she would need to reschedule due to the time. Patient declined to reschedule appointment for another day.

## 2022-12-30 ENCOUNTER — Encounter: Payer: Self-pay | Admitting: Obstetrics and Gynecology

## 2023-01-19 DIAGNOSIS — Z1211 Encounter for screening for malignant neoplasm of colon: Secondary | ICD-10-CM | POA: Diagnosis not present

## 2023-01-26 LAB — COLOGUARD: COLOGUARD: NEGATIVE

## 2023-03-01 DIAGNOSIS — Z0000 Encounter for general adult medical examination without abnormal findings: Secondary | ICD-10-CM | POA: Diagnosis not present

## 2023-03-01 DIAGNOSIS — E559 Vitamin D deficiency, unspecified: Secondary | ICD-10-CM | POA: Diagnosis not present

## 2023-03-01 DIAGNOSIS — Z1231 Encounter for screening mammogram for malignant neoplasm of breast: Secondary | ICD-10-CM | POA: Diagnosis not present

## 2023-03-01 DIAGNOSIS — Z1330 Encounter for screening examination for mental health and behavioral disorders, unspecified: Secondary | ICD-10-CM | POA: Diagnosis not present

## 2023-03-01 DIAGNOSIS — D229 Melanocytic nevi, unspecified: Secondary | ICD-10-CM | POA: Diagnosis not present

## 2023-05-05 DIAGNOSIS — L820 Inflamed seborrheic keratosis: Secondary | ICD-10-CM | POA: Diagnosis not present

## 2023-05-16 ENCOUNTER — Ambulatory Visit
Admission: RE | Admit: 2023-05-16 | Discharge: 2023-05-16 | Disposition: A | Discharge status: Home / Self Care | Source: Ambulatory Visit | Attending: Physician Assistant | Admitting: Physician Assistant

## 2023-05-16 ENCOUNTER — Other Ambulatory Visit: Payer: Self-pay | Admitting: Physician Assistant

## 2023-05-16 DIAGNOSIS — Z1231 Encounter for screening mammogram for malignant neoplasm of breast: Secondary | ICD-10-CM

## 2023-05-17 ENCOUNTER — Other Ambulatory Visit: Payer: Self-pay | Admitting: Physician Assistant

## 2023-05-17 DIAGNOSIS — Z1231 Encounter for screening mammogram for malignant neoplasm of breast: Secondary | ICD-10-CM

## 2023-05-17 DIAGNOSIS — N63 Unspecified lump in unspecified breast: Secondary | ICD-10-CM

## 2023-05-19 ENCOUNTER — Encounter: Payer: Self-pay | Admitting: Obstetrics and Gynecology

## 2023-05-19 DIAGNOSIS — N921 Excessive and frequent menstruation with irregular cycle: Secondary | ICD-10-CM

## 2023-05-19 NOTE — Telephone Encounter (Signed)
 Per EB:  "Can we have her return for urgent EMB and take tylenol an hour before and move the PUS up to soon.  Maybe we can get outside PUS  Start taking daily prometrium 100mg  at bedtime (this is a progesterone)  take at bedtime.  This will help stop the bleeding  Dr. Tia Flowers"

## 2023-05-19 NOTE — Telephone Encounter (Signed)
 Please send rx for prometrium 100mg .  FYI. Pt also taking COCs.

## 2023-05-20 MED ORDER — PROGESTERONE MICRONIZED 100 MG PO CAPS: 100.0000 mg | ORAL_CAPSULE | Freq: Every day | ORAL | 0 refills | Status: DC

## 2023-05-20 NOTE — Telephone Encounter (Signed)
 Rx sent.   "Yale Held, RN  Mckenzie Maldonado, CMA Patient scheduled for PUS on 5/15, declines earlier date. EMB and consult on 5/29."  Routing to provider for final review and encounter closed.

## 2023-05-26 ENCOUNTER — Ambulatory Visit (INDEPENDENT_AMBULATORY_CARE_PROVIDER_SITE_OTHER)

## 2023-05-26 ENCOUNTER — Other Ambulatory Visit: Payer: Self-pay | Admitting: Obstetrics and Gynecology

## 2023-05-26 DIAGNOSIS — Z01419 Encounter for gynecological examination (general) (routine) without abnormal findings: Secondary | ICD-10-CM

## 2023-05-26 DIAGNOSIS — D219 Benign neoplasm of connective and other soft tissue, unspecified: Secondary | ICD-10-CM

## 2023-05-26 DIAGNOSIS — N921 Excessive and frequent menstruation with irregular cycle: Secondary | ICD-10-CM

## 2023-05-26 DIAGNOSIS — Z3041 Encounter for surveillance of contraceptive pills: Secondary | ICD-10-CM

## 2023-05-27 DIAGNOSIS — Z23 Encounter for immunization: Secondary | ICD-10-CM | POA: Diagnosis not present

## 2023-05-27 DIAGNOSIS — R454 Irritability and anger: Secondary | ICD-10-CM | POA: Diagnosis not present

## 2023-05-27 DIAGNOSIS — E663 Overweight: Secondary | ICD-10-CM | POA: Diagnosis not present

## 2023-05-27 DIAGNOSIS — E782 Mixed hyperlipidemia: Secondary | ICD-10-CM | POA: Diagnosis not present

## 2023-06-02 DIAGNOSIS — E782 Mixed hyperlipidemia: Secondary | ICD-10-CM | POA: Diagnosis not present

## 2023-06-09 ENCOUNTER — Encounter: Payer: Self-pay | Admitting: Obstetrics and Gynecology

## 2023-06-09 ENCOUNTER — Ambulatory Visit (INDEPENDENT_AMBULATORY_CARE_PROVIDER_SITE_OTHER): Admitting: Obstetrics and Gynecology

## 2023-06-09 DIAGNOSIS — N938 Other specified abnormal uterine and vaginal bleeding: Secondary | ICD-10-CM

## 2023-06-09 DIAGNOSIS — Z712 Person consulting for explanation of examination or test findings: Secondary | ICD-10-CM

## 2023-06-09 DIAGNOSIS — N946 Dysmenorrhea, unspecified: Secondary | ICD-10-CM | POA: Diagnosis not present

## 2023-06-09 DIAGNOSIS — Z1211 Encounter for screening for malignant neoplasm of colon: Secondary | ICD-10-CM

## 2023-06-09 DIAGNOSIS — R102 Pelvic and perineal pain: Secondary | ICD-10-CM

## 2023-06-09 DIAGNOSIS — Z01812 Encounter for preprocedural laboratory examination: Secondary | ICD-10-CM | POA: Diagnosis not present

## 2023-06-09 DIAGNOSIS — D219 Benign neoplasm of connective and other soft tissue, unspecified: Secondary | ICD-10-CM | POA: Diagnosis not present

## 2023-06-09 LAB — PREGNANCY, URINE: Preg Test, Ur: NEGATIVE

## 2023-06-10 NOTE — Progress Notes (Signed)
 Patient with daily bleeding here today for PUS results. Wanted to discuss prometrium  before starting Reports painful cramping as well. Patient also ilicits urinary symptoms She is frustrated with the daily bleeding  2020 PUS with 9.54cm uterus with 3 fibroids and largest measuring 3.25cm  05/26/23 PUS with increased size of uterus measuring 16cm with largest fibroid at 7.82cm, 2.53cm, 3.76, 2.49cm.  MMG 07/04/23 with breast US  scheduled Colonoscopy: order placed Last pap smear 11/10/22  There were no vitals taken for this visit.      Component Value Date/Time   DIAGPAP  11/10/2022 0940    - Negative for intraepithelial lesion or malignancy (NILM)   DIAGPAP  02/21/2018 0000    NEGATIVE FOR INTRAEPITHELIAL LESIONS OR MALIGNANCY.   DIAGPAP  07/20/2016 0000    NEGATIVE FOR INTRAEPITHELIAL LESIONS OR MALIGNANCY.   HPVHIGH Negative 11/10/2022 0940   ADEQPAP  11/10/2022 0940    Satisfactory for evaluation; transformation zone component PRESENT.   ADEQPAP  02/21/2018 0000    Satisfactory for evaluation  endocervical/transformation zone component PRESENT.   ADEQPAP  07/20/2016 0000    Satisfactory for evaluation  endocervical/transformation zone component ABSENT.    High Risk HPV: Positive  Adequacy:  Satisfactory for evaluation, transformation zone component PRESENT  Diagnosis:  Atypical squamous cells of undetermined significance (ASC-US )   There were no vitals taken for this visit.  OB History     Gravida  0   Para  0   Term  0   Preterm  0   AB  0   Living  0      SAB  0   IAB  0   Ectopic  0   Multiple  0   Live Births  0          Past Surgical History:  Procedure Laterality Date   FOOT SURGERY Left    Past Medical History:  Diagnosis Date   Fibroid    Social History   Socioeconomic History   Marital status: Married    Spouse name: Not on file   Number of children: Not on file   Years of education: Not on file   Highest education  level: Not on file  Occupational History   Not on file  Tobacco Use   Smoking status: Never   Smokeless tobacco: Never  Vaping Use   Vaping status: Never Used  Substance and Sexual Activity   Alcohol  use: Yes    Comment: 1-2/week   Drug use: Never   Sexual activity: Yes    Partners: Male    Birth control/protection: OCP  Other Topics Concern   Not on file  Social History Narrative   Not on file   Social Drivers of Health   Financial Resource Strain: Low Risk  (03/01/2023)   Received from [facility]   Overall Financial Resource Strain (CARDIA)    Difficulty of Paying Living Expenses: Not hard at all  Food Insecurity: No Food Insecurity (03/01/2023)   Received from [facility]   Hunger Vital Sign    Worried About Running Out of Food in the Last Year: Never true    Ran Out of Food in the Last Year: Never true  Transportation Needs: No Transportation Needs (03/01/2023)   Received from [facility] - Transportation    Lack of Transportation (Medical): No    Lack of Transportation (Non-Medical): No  Physical Activity: Insufficiently Active (03/01/2023)   Received from [facility]   Exercise Vital Sign  Days of Exercise per Week: 7 days    Minutes of Exercise per Session: 10 min  Stress: No Stress Concern Present (03/01/2023)   Received from [facility] of Occupational Health - Occupational Stress Questionnaire    Feeling of Stress : Only a little  Social Connections: Socially Integrated (03/01/2023)   Received from [facility]   Social Network    How would you rate your social network (family, work, friends)?: Good participation with social networks   Current Outpatient Medications on File Prior to Visit  Medication Sig Dispense Refill   drospirenone -ethinyl estradiol  (YASMIN ) 3-0.03 MG tablet Take 1 tablet by mouth daily. 112 tablet 2   fexofenadine (ALLEGRA) 180 MG tablet as needed.     FLUoxetine (PROZAC) 10 MG capsule Take 20  mg by mouth.     loratadine (CLARITIN) 10 MG tablet Take 10 mg by mouth daily.     phentermine (ADIPEX-P) 37.5 MG tablet Take 37.5 mg by mouth daily.     progesterone  (PROMETRIUM ) 100 MG capsule Take 1 capsule (100 mg total) by mouth daily. (Patient not taking: Reported on 06/09/2023) 30 capsule 0   [DISCONTINUED] Norethindrone  Acetate-Ethinyl Estrad-FE (LOESTRIN 24 FE) 1-20 MG-MCG(24) tablet Take 1 tablet by mouth daily. 3 Package 0   No current facility-administered medications on file prior to visit.    A/p enlarging symptomatic fibroid uterus, dysmenorrhea All options discussed.  Offered MRI to evaluate for sarcomatous changes but pathology is more sensitive and cost can be a factor.  Discussed concern though that the fibroids have grown in perimenopausal picture.  Encouraged patient to begin prometrium  to help control the bleeding.  She would like to move forward with the [facility]. The procedure was discussed in detail. She would like to wait until Jan.  To RTC for annual with EMB closer to surgery.  30 minutes spent on reviewing records, imaging,  and one on one patient time and counseling patient and documentation Dr. Tia Flowers

## 2023-06-16 ENCOUNTER — Other Ambulatory Visit: Payer: Self-pay | Admitting: Obstetrics and Gynecology

## 2023-06-16 DIAGNOSIS — N921 Excessive and frequent menstruation with irregular cycle: Secondary | ICD-10-CM

## 2023-06-16 NOTE — Telephone Encounter (Signed)
 Med refill request: progesterone  100 mg Last OV for us  consult: 06/09/23  Last AEX: 11/10/22 Next OV: 09/27/23 EMB Last MMG (if hormonal med) 12/30/21 BI-RADS 3 benign Refill authorized: progesterone  100 mg. Please approve or deny as appropriate.

## 2023-06-19 ENCOUNTER — Encounter: Payer: Self-pay | Admitting: Obstetrics and Gynecology

## 2023-06-20 NOTE — Telephone Encounter (Signed)
 Dr. Tia Flowers -patient has not increased to 200 mg Prometrium  at bedtime.   Please advise if you want patient to increase prometrium  to 200 mg or switch to Aygestin .

## 2023-06-21 MED ORDER — PROGESTERONE 200 MG PO CAPS: 200.0000 mg | ORAL_CAPSULE | Freq: Every day | ORAL | 4 refills | Status: DC

## 2023-07-04 ENCOUNTER — Ambulatory Visit
Admission: RE | Admit: 2023-07-04 | Discharge: 2023-07-04 | Disposition: A | Discharge status: Home / Self Care | Source: Ambulatory Visit | Attending: Physician Assistant | Admitting: Physician Assistant

## 2023-07-04 DIAGNOSIS — N63 Unspecified lump in unspecified breast: Secondary | ICD-10-CM

## 2023-07-04 DIAGNOSIS — N6314 Unspecified lump in the right breast, lower inner quadrant: Secondary | ICD-10-CM | POA: Diagnosis not present

## 2023-07-12 DIAGNOSIS — R454 Irritability and anger: Secondary | ICD-10-CM | POA: Diagnosis not present

## 2023-07-12 DIAGNOSIS — E663 Overweight: Secondary | ICD-10-CM | POA: Diagnosis not present

## 2023-07-12 DIAGNOSIS — Z6828 Body mass index (BMI) 28.0-28.9, adult: Secondary | ICD-10-CM | POA: Diagnosis not present

## 2023-07-12 DIAGNOSIS — D259 Leiomyoma of uterus, unspecified: Secondary | ICD-10-CM | POA: Diagnosis not present

## 2023-07-26 DIAGNOSIS — D2239 Melanocytic nevi of other parts of face: Secondary | ICD-10-CM | POA: Diagnosis not present

## 2023-07-26 DIAGNOSIS — D485 Neoplasm of uncertain behavior of skin: Secondary | ICD-10-CM | POA: Diagnosis not present

## 2023-07-26 DIAGNOSIS — L821 Other seborrheic keratosis: Secondary | ICD-10-CM | POA: Diagnosis not present

## 2023-07-26 DIAGNOSIS — L578 Other skin changes due to chronic exposure to nonionizing radiation: Secondary | ICD-10-CM | POA: Diagnosis not present

## 2023-07-26 DIAGNOSIS — D225 Melanocytic nevi of trunk: Secondary | ICD-10-CM | POA: Diagnosis not present

## 2023-07-26 DIAGNOSIS — L814 Other melanin hyperpigmentation: Secondary | ICD-10-CM | POA: Diagnosis not present

## 2023-09-06 ENCOUNTER — Encounter: Payer: Self-pay | Admitting: Obstetrics and Gynecology

## 2023-09-13 ENCOUNTER — Other Ambulatory Visit: Payer: Self-pay | Admitting: Obstetrics and Gynecology

## 2023-09-13 NOTE — Telephone Encounter (Signed)
 Med refill request: Progesterone  200 Last AEX:  last OV 06/09/23 Next AEX: 11/17/23 Last MMG (if hormonal med) 07/04/23 birads cat 2 benign  Refill authorized: last rx 06/21/23 #30 with 4 refills. Please approve or deny

## 2023-09-20 ENCOUNTER — Telehealth: Payer: Self-pay | Admitting: Obstetrics and Gynecology

## 2023-09-20 DIAGNOSIS — N921 Excessive and frequent menstruation with irregular cycle: Secondary | ICD-10-CM

## 2023-09-20 DIAGNOSIS — N938 Other specified abnormal uterine and vaginal bleeding: Secondary | ICD-10-CM

## 2023-09-20 DIAGNOSIS — D219 Benign neoplasm of connective and other soft tissue, unspecified: Secondary | ICD-10-CM

## 2023-09-20 NOTE — Telephone Encounter (Signed)
Order for EMB

## 2023-09-26 ENCOUNTER — Encounter: Payer: Self-pay | Admitting: Obstetrics and Gynecology

## 2023-09-26 ENCOUNTER — Other Ambulatory Visit (HOSPITAL_COMMUNITY)
Admission: RE | Admit: 2023-09-26 | Discharge: 2023-09-26 | Disposition: A | Discharge status: Home / Self Care | Source: Ambulatory Visit | Attending: Obstetrics and Gynecology | Admitting: Obstetrics and Gynecology

## 2023-09-26 ENCOUNTER — Ambulatory Visit (INDEPENDENT_AMBULATORY_CARE_PROVIDER_SITE_OTHER): Admitting: Obstetrics and Gynecology

## 2023-09-26 VITALS — BP 122/70 | HR 91 | Wt 142.6 lb

## 2023-09-26 DIAGNOSIS — N946 Dysmenorrhea, unspecified: Secondary | ICD-10-CM | POA: Insufficient documentation

## 2023-09-26 DIAGNOSIS — Z01812 Encounter for preprocedural laboratory examination: Secondary | ICD-10-CM

## 2023-09-26 DIAGNOSIS — R102 Pelvic and perineal pain: Secondary | ICD-10-CM | POA: Insufficient documentation

## 2023-09-26 DIAGNOSIS — N938 Other specified abnormal uterine and vaginal bleeding: Secondary | ICD-10-CM | POA: Diagnosis not present

## 2023-09-26 DIAGNOSIS — D219 Benign neoplasm of connective and other soft tissue, unspecified: Secondary | ICD-10-CM | POA: Insufficient documentation

## 2023-09-26 LAB — PREGNANCY, URINE: Preg Test, Ur: NEGATIVE

## 2023-09-26 MED ORDER — PROGESTERONE MICRONIZED 100 MG PO CAPS
100.0000 mg | ORAL_CAPSULE | Freq: Every day | ORAL | 1 refills | Status: DC
Start: 2023-09-26 — End: 2023-11-17

## 2023-09-26 NOTE — Progress Notes (Signed)
 Patient with daily bleeding here today for EMB prior to surgery in January Bleeding stopped at 200mg  of prometrium , but she is feeling tired and has occasional hot flashes and would like to try to go down to 100mg  at bedtime until surgery Reports painful cramping as well. Patient also ilicits urinary symptoms She is frustrated with the daily bleeding  2020 PUS with 9.54cm uterus with 3 fibroids and largest measuring 3.25cm  05/26/23 PUS with increased size of uterus measuring 16cm with largest fibroid at 7.82cm, 2.53cm, 3.76, 2.49cm.  MMG 07/04/23 with breast US  scheduled Colonoscopy: order placed Last pap smear 11/10/22- she has her annual exam scheduled  Blood pressure 122/70, pulse 91, weight 142 lb 9.6 oz (64.7 kg), SpO2 98%.      Component Value Date/Time   DIAGPAP  11/10/2022 0940    - Negative for intraepithelial lesion or malignancy (NILM)   DIAGPAP  02/21/2018 0000    NEGATIVE FOR INTRAEPITHELIAL LESIONS OR MALIGNANCY.   DIAGPAP  07/20/2016 0000    NEGATIVE FOR INTRAEPITHELIAL LESIONS OR MALIGNANCY.   HPVHIGH Negative 11/10/2022 0940   ADEQPAP  11/10/2022 0940    Satisfactory for evaluation; transformation zone component PRESENT.   ADEQPAP  02/21/2018 0000    Satisfactory for evaluation  endocervical/transformation zone component PRESENT.   ADEQPAP  07/20/2016 0000    Satisfactory for evaluation  endocervical/transformation zone component ABSENT.    High Risk HPV: Positive  Adequacy:  Satisfactory for evaluation, transformation zone component PRESENT  Diagnosis:  Atypical squamous cells of undetermined significance (ASC-US )   Blood pressure 122/70, pulse 91, weight 142 lb 9.6 oz (64.7 kg), SpO2 98%.  OB History     Gravida  0   Para  0   Term  0   Preterm  0   AB  0   Living  0      SAB  0   IAB  0   Ectopic  0   Multiple  0   Live Births  0          Past Surgical History:  Procedure Laterality Date   FOOT SURGERY Left    Past  Medical History:  Diagnosis Date   Fibroid    Social History   Socioeconomic History   Marital status: Married    Spouse name: Not on file   Number of children: Not on file   Years of education: Not on file   Highest education level: Not on file  Occupational History   Not on file  Tobacco Use   Smoking status: Never   Smokeless tobacco: Never  Vaping Use   Vaping status: Never Used  Substance and Sexual Activity   Alcohol  use: Yes    Comment: 1-2/week   Drug use: Never   Sexual activity: Yes    Partners: Male    Birth control/protection: OCP  Other Topics Concern   Not on file  Social History Narrative   Not on file   Social Drivers of Health   Financial Resource Strain: Low Risk  (03/01/2023)   Received from [facility]   Overall Financial Resource Strain (CARDIA)    Difficulty of Paying Living Expenses: Not hard at all  Food Insecurity: No Food Insecurity (03/01/2023)   Received from [facility]   Hunger Vital Sign    Within the past 12 months, you worried that your food would run out before you got the money to buy more.: Never true    Within the past 12 months,  the food you bought just didn't last and you didn't have money to get more.: Never true  Transportation Needs: No Transportation Needs (03/01/2023)   Received from [facility]   PRAPARE - Transportation    Lack of Transportation (Medical): No    Lack of Transportation (Non-Medical): No  Physical Activity: Insufficiently Active (03/01/2023)   Received from [facility]   Exercise Vital Sign    On average, how many days per week do you engage in moderate to strenuous exercise (like a brisk walk)?: 7 days    On average, how many minutes do you engage in exercise at this level?: 10 min  Stress: No Stress Concern Present (03/01/2023)   Received from [facility] of Occupational Health - Occupational Stress Questionnaire    Feeling of Stress : Only a little  Social Connections:  Socially Integrated (03/01/2023)   Received from [facility]   Social Network    How would you rate your social network (family, work, friends)?: Good participation with social networks   Current Outpatient Medications on File Prior to Visit  Medication Sig Dispense Refill   drospirenone -ethinyl estradiol  (YASMIN ) 3-0.03 MG tablet Take 1 tablet by mouth daily. 112 tablet 2   fexofenadine (ALLEGRA) 180 MG tablet as needed.     FLUoxetine (PROZAC) 10 MG capsule Take 20 mg by mouth.     loratadine (CLARITIN) 10 MG tablet Take 10 mg by mouth daily.     phentermine (ADIPEX-P) 37.5 MG tablet Take 37.5 mg by mouth daily.     progesterone  (PROMETRIUM ) 200 MG capsule TAKE 1 CAPSULE BY MOUTH EVERY DAY 90 capsule 1   [DISCONTINUED] Norethindrone  Acetate-Ethinyl Estrad-FE (LOESTRIN 24 FE) 1-20 MG-MCG(24) tablet Take 1 tablet by mouth daily. 3 Package 0   No current facility-administered medications on file prior to visit.   TIME OUT PERFROMED PROCEDURE: EMB Consent obtained for the procedure.  A bivalve speculum was placed in the vagina.  The cervix was grasped with a single tooth tenaculum.  Pipelle was inserted and rotated. Uterus sound to 15cm. Adequate specimen was obtained and sent to pathology.  All instruments were removed.  Patient tolerated the procedure well.  To notify patient of the results.   A/p enlarging symptomatic fibroid uterus, dysmenorrhea EMB collected today.  Counseled on the importance. RTC for annual exam and preop.   Dr. Glennon

## 2023-09-26 NOTE — Addendum Note (Signed)
 Addended by: GLENNON ALMARIE POUR on: 09/26/2023 04:15 PM   Modules accepted: Orders

## 2023-09-28 ENCOUNTER — Ambulatory Visit: Payer: Self-pay | Admitting: Obstetrics and Gynecology

## 2023-09-28 LAB — SURGICAL PATHOLOGY

## 2023-10-13 IMAGING — US US BREAST*R* LIMITED INC AXILLA
1 series · 4 of 4 positions shown · non-contrast
Comparison: Previous exams.

CLINICAL DATA: 45-year-old female presenting for six-month
follow-up of probably benign bilateral breast masses.

EXAM:
ULTRASOUND OF THE BILATERAL BREAST

[Series 1: us breast*right* limited inc axilla · 0.05mm/px · 4 of 4 slices shown]
[im 1/4]
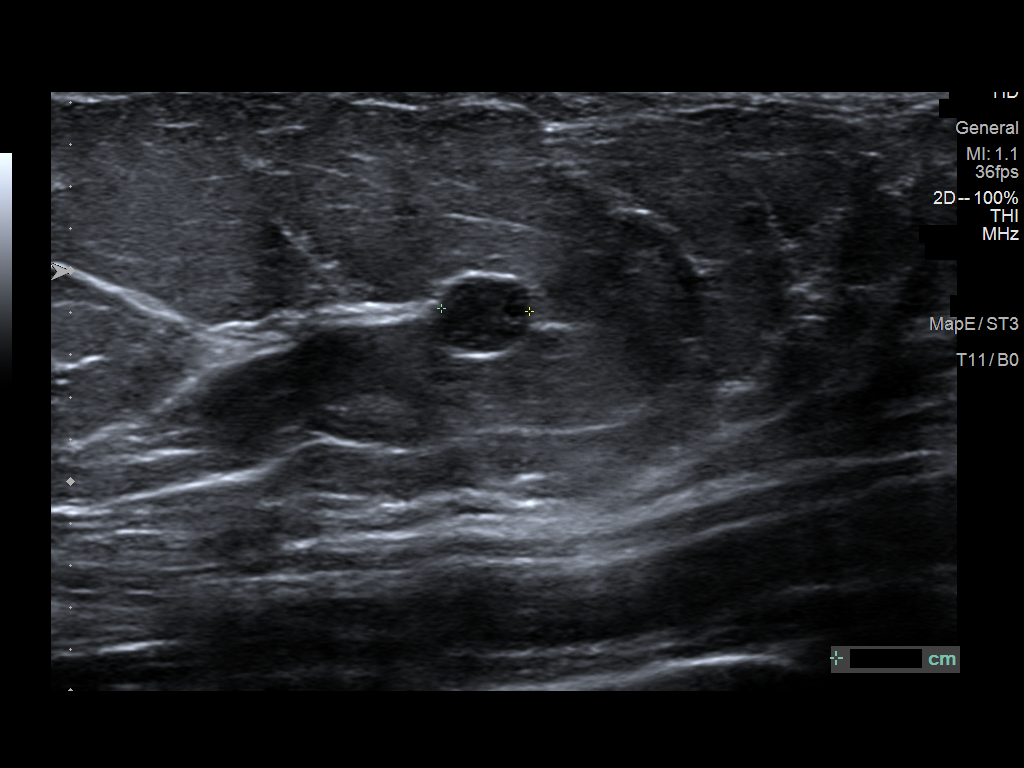
[im 2/4]
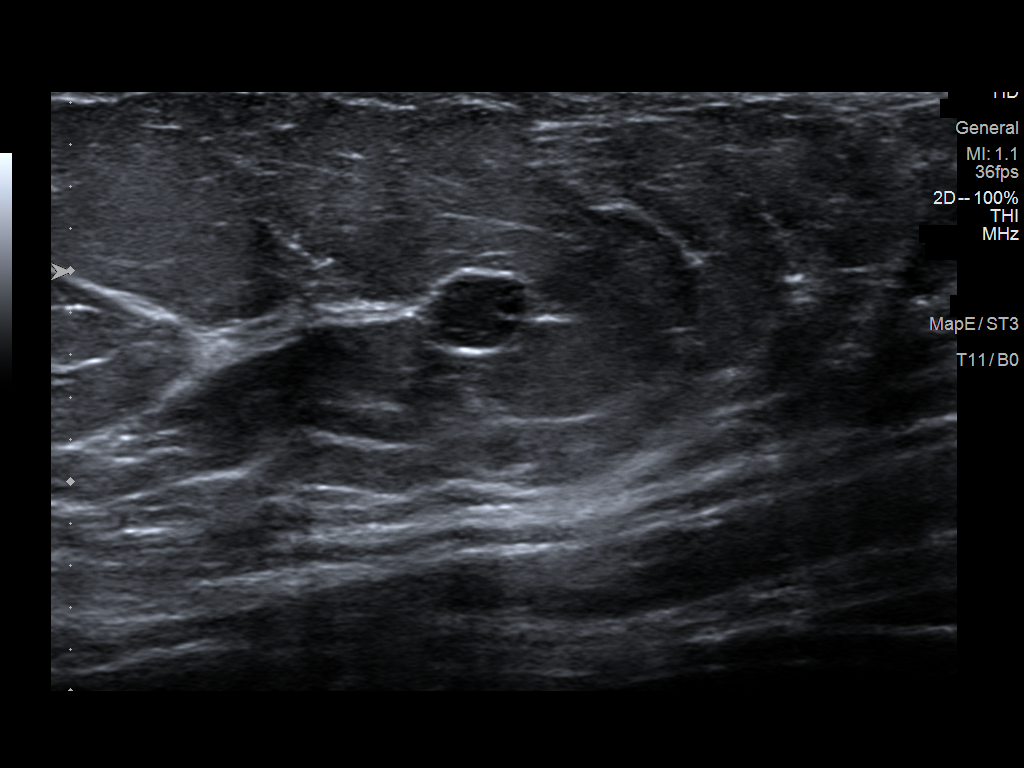
[im 3/4]
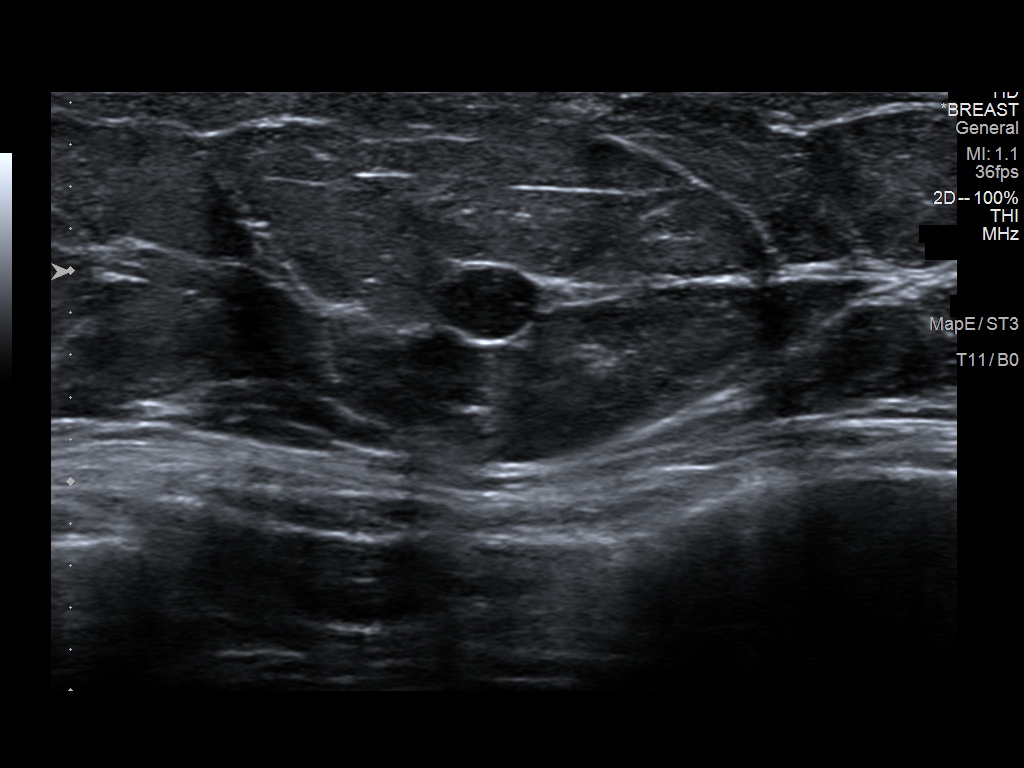
[im 4/4]
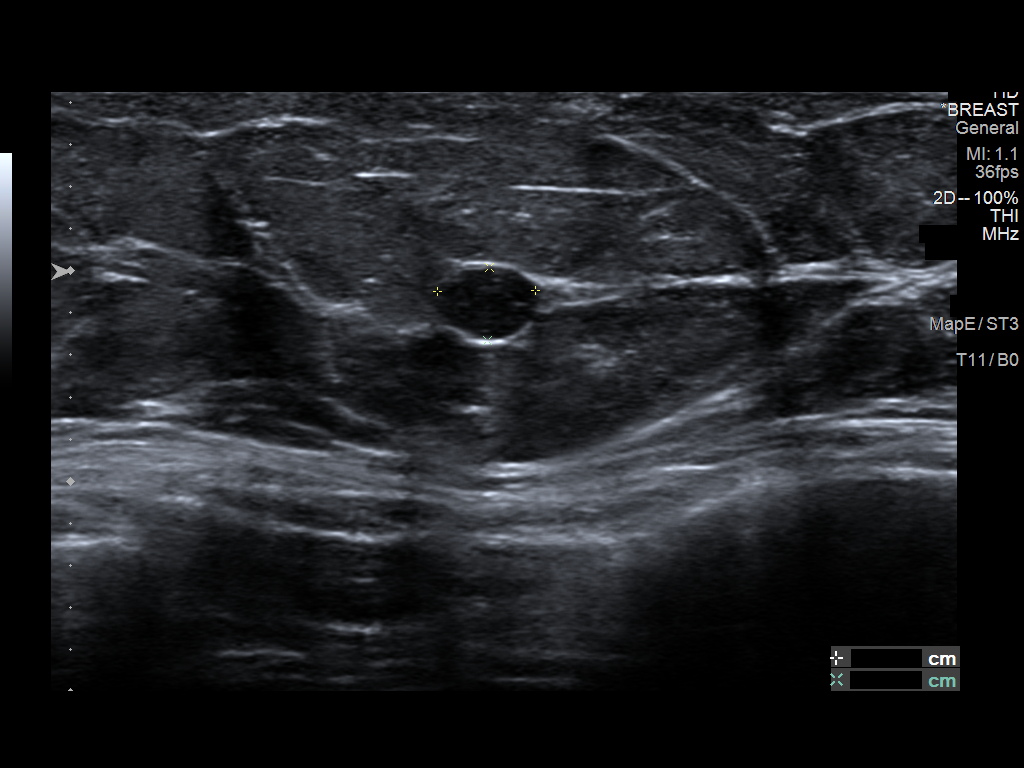

[4 of 4 positions shown; findings below may reference images not displayed]

FINDINGS: Right breast:

Targeted ultrasound is performed in the right breast at 5 o'clock 4
cm from the nipple demonstrating an oval circumscribed hypoechoic
mass measuring 0.4 x 0.4 x 0.5 cm, previously measuring 0.4 x 0.4 x
0.4 cm.

Left breast:

Targeted ultrasound is performed in the left breast at 2 o'clock 4
cm from the nipple demonstrating an oval circumscribed hypoechoic
mass measuring 0.3 x 0.2 x 0.3 cm, previously measuring is 0.4 x
x 0.4 cm. Another similar appearing mass at 2 o'clock 4 cm from the
nipple measures 0.2 x 0.2 x 0.2 cm, previously measuring 0.4 x 0.3 x
0.4 cm.
IMPRESSION: 1.  Stable probably benign right breast mass at 5 o'clock.

2. Interval mild decrease in size of the probably benign masses in
the left breast at 2 o'clock.

RECOMMENDATION:
Diagnostic bilateral mammogram and bilateral ultrasound in 6 months.

I have discussed the findings and recommendations with the patient
who agrees to short-term follow-up. If applicable, a reminder letter
will be sent to the patient regarding the next appointment.

BI-RADS CATEGORY  3: Probably benign.

## 2023-10-29 ENCOUNTER — Other Ambulatory Visit: Payer: Self-pay | Admitting: Obstetrics and Gynecology

## 2023-10-29 DIAGNOSIS — Z3041 Encounter for surveillance of contraceptive pills: Secondary | ICD-10-CM

## 2023-10-31 NOTE — Telephone Encounter (Signed)
 Med refill request: Drospirenone -ethinyl estradiol  (YASMIN ) 3-0.03 MG tablet  Start:  11/25/22 Disp:  112 tablet Refills:  2  Last AEX:  11/10/22 Next AEX:  11/17/23 Last MMG (if hormonal med):  07/04/23 Refill authorized? Please Advise.

## 2023-11-09 ENCOUNTER — Other Ambulatory Visit (HOSPITAL_BASED_OUTPATIENT_CLINIC_OR_DEPARTMENT_OTHER): Payer: Self-pay

## 2023-11-09 MED ORDER — FLUZONE 0.5 ML IM SUSY
0.5000 mL | PREFILLED_SYRINGE | Freq: Once | INTRAMUSCULAR | 0 refills | Status: AC
Start: 2023-11-09 — End: 2023-11-10
  Filled 2023-11-09: qty 0.5, 1d supply, fill #0

## 2023-11-10 ENCOUNTER — Other Ambulatory Visit (HOSPITAL_BASED_OUTPATIENT_CLINIC_OR_DEPARTMENT_OTHER): Payer: Self-pay

## 2023-11-17 ENCOUNTER — Other Ambulatory Visit (HOSPITAL_COMMUNITY)
Admission: RE | Admit: 2023-11-17 | Discharge: 2023-11-17 | Disposition: A | Discharge status: Home / Self Care | Source: Ambulatory Visit | Attending: Obstetrics and Gynecology | Admitting: Obstetrics and Gynecology

## 2023-11-17 ENCOUNTER — Ambulatory Visit: Admitting: Obstetrics and Gynecology

## 2023-11-17 ENCOUNTER — Encounter: Payer: Self-pay | Admitting: Obstetrics and Gynecology

## 2023-11-17 VITALS — BP 112/78 | HR 96 | Wt 145.8 lb

## 2023-11-17 DIAGNOSIS — Z01419 Encounter for gynecological examination (general) (routine) without abnormal findings: Secondary | ICD-10-CM | POA: Diagnosis not present

## 2023-11-17 DIAGNOSIS — N898 Other specified noninflammatory disorders of vagina: Secondary | ICD-10-CM | POA: Diagnosis not present

## 2023-11-17 DIAGNOSIS — Z1331 Encounter for screening for depression: Secondary | ICD-10-CM | POA: Diagnosis not present

## 2023-11-17 MED ORDER — PROGESTERONE MICRONIZED 100 MG PO CAPS: 200.0000 mg | ORAL_CAPSULE | Freq: Every day | ORAL | 0 refills | Status: DC

## 2023-11-17 NOTE — Progress Notes (Signed)
 48 y.o. y.o. female here for annual exam. No LMP recorded. (Menstrual status: Oral contraceptives).   Patient with daily bleeding here today for EMB prior to surgery in January Bleeding stopped at 200mg  of prometrium , but she is feeling tired and has occasional hot flashes and would like to try to go down to 100mg  at bedtime until surgery Reports painful cramping as well. Patient also ilicits urinary symptoms She is frustrated with the daily bleeding  2020 PUS with 9.54cm uterus with 3 fibroids and largest measuring 3.25cm  05/26/23 PUS with increased size of uterus measuring 16cm with largest fibroid at 7.82cm, 2.53cm, 3.76, 2.49cm.  MMG 07/04/23 with breast US  scheduled Colonoscopy: order placed Last pap smear 11/10/22- she has her annual exam scheduled Ocp's with the prometrium  has stopped the bleeding for now. Lower dose of prometrium , she started bleeding again.  Body mass index is 27.32 kg/m.     11/17/2023    3:17 PM 09/26/2023    3:38 PM 02/06/2015   10:41 AM  Depression screen PHQ 2/9  Decreased Interest 0 0 0  Down, Depressed, Hopeless 0 0 0  PHQ - 2 Score 0 0 0    Blood pressure 112/78, pulse 96, weight 145 lb 12.8 oz (66.1 kg), SpO2 99%.     Component Value Date/Time   DIAGPAP  11/10/2022 0940    - Negative for intraepithelial lesion or malignancy (NILM)   DIAGPAP  02/21/2018 0000    NEGATIVE FOR INTRAEPITHELIAL LESIONS OR MALIGNANCY.   DIAGPAP  07/20/2016 0000    NEGATIVE FOR INTRAEPITHELIAL LESIONS OR MALIGNANCY.   HPVHIGH Negative 11/10/2022 0940   ADEQPAP  11/10/2022 0940    Satisfactory for evaluation; transformation zone component PRESENT.   ADEQPAP  02/21/2018 0000    Satisfactory for evaluation  endocervical/transformation zone component PRESENT.   ADEQPAP  07/20/2016 0000    Satisfactory for evaluation  endocervical/transformation zone component ABSENT.    GYN HISTORY:    Component Value Date/Time   DIAGPAP  11/10/2022 0940    - Negative  for intraepithelial lesion or malignancy (NILM)   DIAGPAP  02/21/2018 0000    NEGATIVE FOR INTRAEPITHELIAL LESIONS OR MALIGNANCY.   DIAGPAP  07/20/2016 0000    NEGATIVE FOR INTRAEPITHELIAL LESIONS OR MALIGNANCY.   HPVHIGH Negative 11/10/2022 0940   ADEQPAP  11/10/2022 0940    Satisfactory for evaluation; transformation zone component PRESENT.   ADEQPAP  02/21/2018 0000    Satisfactory for evaluation  endocervical/transformation zone component PRESENT.   ADEQPAP  07/20/2016 0000    Satisfactory for evaluation  endocervical/transformation zone component ABSENT.    OB History  Gravida Para Term Preterm AB Living  0 0 0 0 0 0  SAB IAB Ectopic Multiple Live Births  0 0 0 0 0    Past Medical History:  Diagnosis Date   Fibroid     Past Surgical History:  Procedure Laterality Date   FOOT SURGERY Left     Current Outpatient Medications on File Prior to Visit  Medication Sig Dispense Refill   drospirenone -ethinyl estradiol  (YASMIN ) 3-0.03 MG tablet TAKE 1 TABLET BY MOUTH EVERY DAY 84 tablet 6   fexofenadine (ALLEGRA) 180 MG tablet as needed.     FLUoxetine (PROZAC) 10 MG capsule Take 20 mg by mouth.     loratadine (CLARITIN) 10 MG tablet Take 10 mg by mouth daily.     phentermine (ADIPEX-P) 37.5 MG tablet Take 37.5 mg by mouth daily.     progesterone  (PROMETRIUM ) 100 MG  capsule Take 1 capsule (100 mg total) by mouth daily. (Patient taking differently: Take 200 mg by mouth daily.) 90 capsule 1   [DISCONTINUED] Norethindrone  Acetate-Ethinyl Estrad-FE (LOESTRIN 24 FE) 1-20 MG-MCG(24) tablet Take 1 tablet by mouth daily. 3 Package 0   No current facility-administered medications on file prior to visit.    Social History   Socioeconomic History   Marital status: Married    Spouse name: Not on file   Number of children: Not on file   Years of education: Not on file   Highest education level: Not on file  Occupational History   Not on file  Tobacco Use   Smoking status: Never    Smokeless tobacco: Never  Vaping Use   Vaping status: Never Used  Substance and Sexual Activity   Alcohol  use: Yes    Comment: 1-2/week   Drug use: Never   Sexual activity: Yes    Partners: Male    Birth control/protection: OCP  Other Topics Concern   Not on file  Social History Narrative   Not on file   Social Drivers of Health   Financial Resource Strain: Low Risk  (03/01/2023)   Received from [facility]   Overall Financial Resource Strain (CARDIA)    Difficulty of Paying Living Expenses: Not hard at all  Food Insecurity: No Food Insecurity (03/01/2023)   Received from [facility]   Hunger Vital Sign    Within the past 12 months, you worried that your food would run out before you got the money to buy more.: Never true    Within the past 12 months, the food you bought just didn't last and you didn't have money to get more.: Never true  Transportation Needs: No Transportation Needs (03/01/2023)   Received from [facility] - Transportation    Lack of Transportation (Medical): No    Lack of Transportation (Non-Medical): No  Physical Activity: Insufficiently Active (03/01/2023)   Received from [facility]   Exercise Vital Sign    On average, how many days per week do you engage in moderate to strenuous exercise (like a brisk walk)?: 7 days    On average, how many minutes do you engage in exercise at this level?: 10 min  Stress: No Stress Concern Present (03/01/2023)   Received from [facility] - Occupational Stress Questionnaire    Feeling of Stress : Only a little  Social Connections: Socially Integrated (03/01/2023)   Received from [facility]   Social Network    How would you rate your social network (family, work, friends)?: Good participation with social networks  Intimate Partner Violence: Not At Risk (03/01/2023)   Received from [facility]   HITS    Over the last 12 months how often did your partner  physically hurt you?: Never    Over the last 12 months how often did your partner insult you or talk down to you?: Never    Over the last 12 months how often did your partner threaten you with physical harm?: Never    Over the last 12 months how often did your partner scream or curse at you?: Never    Family History  Problem Relation Age of Onset   Uterine cancer Mother    Cancer Father 8       small cell, stage 4, lung, prostate and bladder     No Known Allergies    Patient's last menstrual period was  No LMP recorded. (Menstrual status: Oral contraceptives)..             Review of Systems Alls systems reviewed and are negative.     Physical Exam Constitutional:      Appearance: Normal appearance.  Genitourinary:     Vulva and urethral meatus normal.     No lesions in the vagina.     Genitourinary Comments: 15cm fibroid uterus     Right Labia: No rash, lesions or skin changes.    Left Labia: No lesions, skin changes or rash.       No vaginal discharge or tenderness.     No vaginal prolapse present.    No vaginal atrophy present.     Right Adnexa: not tender, not palpable and no mass present.    Left Adnexa: not tender, not palpable and no mass present.    No cervical motion tenderness or discharge.     Uterus is enlarged and irregular.     Uterus is not tender.  Breasts:    Right: Normal.     Left: Normal.  HENT:     Head: Normocephalic.  Neck:     Thyroid : No thyroid  mass, thyromegaly or thyroid  tenderness.  Cardiovascular:     Rate and Rhythm: Normal rate and regular rhythm.     Heart sounds: Normal heart sounds, S1 normal and S2 normal.  Pulmonary:     Effort: Pulmonary effort is normal.     Breath sounds: Normal breath sounds and air entry.  Abdominal:     General: There is no distension.     Palpations: Abdomen is soft. There is no mass.     Tenderness: There is no abdominal tenderness. There is no guarding or rebound.  Musculoskeletal:         General: Normal range of motion.     Cervical back: Full passive range of motion without pain, normal range of motion and neck supple. No tenderness.     Right lower leg: No edema.     Left lower leg: No edema.  Neurological:     Mental Status: She is alert.  Skin:    General: Skin is warm.  Psychiatric:        Mood and Affect: Mood normal.        Behavior: Behavior normal.        Thought Content: Thought content normal.  Vitals and nursing note reviewed. Exam conducted with a chaperone present.       A:         Well Woman GYN exam Enlarging Fibroid uterus Menorrhagia Dysmenorrhea Desires [facility] soon                              P:        Pap smear collected today Encouraged annual mammogram screening Colon cancer screening up-to-date DXA not indicated Labs and immunizations to do with PMD Discussed breast self exams Encouraged healthy lifestyle practices Encouraged Vit D and Calcium   No follow-ups on file.  Mckenzie Maldonado

## 2023-11-17 NOTE — Patient Instructions (Addendum)
 Perimenopause/Menopause suggestions   You should be getting 1,200 mg of calcium a day between your diet and supplements and at least 3000 IU a day of Vit D. You should exercise regularly with weight bearing exercises. I would recommend a bone density q 2 years in the 50's.  We loose 5% per year in this time period of our bone density, due to the loss of estrogen. Magnesium at bedtime for our sleep and bones (follow recommended dose on bottle)  Please read The New Menopause my Dr. Ronal Estefana Morton and Estrogen Matters. Both on [employer]  Try to avoid caffeine and alcohol  in this period, as they can make symptoms worse  Continue annual mammograms, unless told sooner  Please reach out with any questions Almarie MARLA Carpen

## 2023-11-18 LAB — SURESWAB® ADVANCED VAGINITIS PLUS,TMA
C. trachomatis RNA, TMA: NOT DETECTED
CANDIDA SPECIES: NOT DETECTED
Candida glabrata: NOT DETECTED
N. gonorrhoeae RNA, TMA: NOT DETECTED
SURESWAB(R) ADV BACTERIAL VAGINOSIS(BV),TMA: NEGATIVE
TRICHOMONAS VAGINALIS (TV),TMA: NOT DETECTED

## 2023-11-21 ENCOUNTER — Ambulatory Visit: Payer: Self-pay | Admitting: Obstetrics and Gynecology

## 2023-11-23 LAB — CYTOLOGY - PAP
Comment: NEGATIVE
Diagnosis: UNDETERMINED — AB
High risk HPV: NEGATIVE

## 2023-11-28 DIAGNOSIS — R42 Dizziness and giddiness: Secondary | ICD-10-CM | POA: Diagnosis not present

## 2023-11-28 DIAGNOSIS — L299 Pruritus, unspecified: Secondary | ICD-10-CM | POA: Diagnosis not present

## 2023-11-28 DIAGNOSIS — E663 Overweight: Secondary | ICD-10-CM | POA: Diagnosis not present

## 2023-11-28 DIAGNOSIS — Z6828 Body mass index (BMI) 28.0-28.9, adult: Secondary | ICD-10-CM | POA: Diagnosis not present

## 2023-12-16 NOTE — Therapy (Signed)
 OUTPATIENT PHYSICAL THERAPY VESTIBULAR EVALUATION     Patient Name: Mckenzie Maldonado MRN: [id number] DOB:[birth year], 48 y.o., female Today's Date: 12/19/2023  END OF SESSION:  PT End of Session - 12/19/23 1615     Visit Number 1    Number of Visits 1    Date for Recertification  12/19/23    Authorization Type BCBS    PT Start Time 1530    PT Stop Time 1610    PT Time Calculation (min) 40 min    Activity Tolerance Patient tolerated treatment well    Behavior During Therapy [facility] for tasks assessed/performed          Past Medical History:  Diagnosis Date   Fibroid    Past Surgical History:  Procedure Laterality Date   FOOT SURGERY Left    Patient Active Problem List   Diagnosis Date Noted   Pain of joint of left ankle and foot 01/22/2020   Family history of uterine cancer 02/20/2018   Right knee pain 12/20/2016    PCP:  Shepperson, Kirstin, PA-C REFERRING PROVIDER:  Donata Snowman, PA-C  REFERRING DIAG: R42 (ICD-10-CM) - Dizziness and giddiness  THERAPY DIAG:  Dizziness and giddiness  ONSET DATE: 1 month  Rationale for Evaluation and Treatment: Rehabilitation  SUBJECTIVE:   SUBJECTIVE STATEMENT: Patient reports she had her first episode of dizziness 8-10 years ago when she had episode of vertigo for a number of months. Was given some home exercises and it eventually went away. New episode 1 month ago when she woke up and feeling like she was slanted when walking. A few days later she rolled in bed and it made the room spin. This happened one time but since then still feeling swimmy headed. It has improved since it started but there are still some hints of it. Denies head trauma, vision changes/double vision, hearing loss, tinnitus, migraines, changes in balance. Reports that she had a cold or COVID prior to this episode.     Pt accompanied by: self  PERTINENT HISTORY: L foot surgery  PAIN:  Are you having pain? No  PRECAUTIONS: None  RED  FLAGS: None   WEIGHT BEARING RESTRICTIONS: No  FALLS: Has patient fallen in last 6 months? No  LIVING ENVIRONMENT: Lives with: lives with their spouse Lives in: House/apartment Stairs: couple steps to enter; bedroom on 2nd floor   PLOF: Independent, Vocation/Vocational requirements: desk work, [facility] [employer], and Leisure: pickleball, running  PATIENT GOALS: improve dizziness   OBJECTIVE:  Note: Objective measures were completed at Evaluation unless otherwise noted.  DIAGNOSTIC FINDINGS: none recent  COGNITION: Overall cognitive status: Within functional limits for tasks assessed   SENSATION: Pt denies N/T in UEs/LEs   POSTURE:  No Significant postural limitations  GAIT: Gait pattern: WFL Assistive device utilized: None Level of assistance: Complete Independence   PATIENT SURVEYS:  DHI: 12/100   VESTIBULAR ASSESSMENT   GENERAL OBSERVATION: pt wears monovision contacts    OCULOMOTOR EXAM: Ocular Alignment: Cover/Uncover Test: WNL Cover/Cross Cover Test: WNL   Ocular ROM: No Limitations   Spontaneous Nystagmus: absent   Gaze-Induced Nystagmus: absent   Smooth Pursuits: intact   Saccades: intact   VESTIBULAR - OCULAR REFLEX:    Slow VOR: Normal; pt notes start of some dizziness    VOR Cancellation: Normal   Head-Impulse Test: HIT Right: negative HIT Left: negative   AUDITORY SCREEN:  Rinne Test WNL B    POSITIONAL TESTING:  Right Roll Test: negative Left Roll Test: negative  Right Loaded  Dix-Hallpike: negative  Left Loaded Dix-Hallpike: negative but appeared like a very subtle start of torsion; c/o mild dizziness upon sitting up  Right Sidelying: negative; also tried with EC and this was also symptomatic  Left Sidelying: negative; also tried with EC and this was also symptomatic    Standing head turns to targets 30: asymptomatic  Standing head nods to targets 30: asymptomatic                                                                                                                                 TREATMENT DATE: 12/19/23   PATIENT EDUCATION: Education details: prognosis, POC, edu on BPPV including risk factors and etiology, recommendations to maintain vestibular health, 30 day hold per pt's request Person educated: Patient Education method: Explanation Education comprehension: verbalized understanding  HOME EXERCISE PROGRAM:  GOALS: Goals reviewed with patient? Yes  SHORT TERM GOALS=LTGs: Target date: 12/19/23  Patient to report understanding of edu on BPPV. Baseline: provided  Goal status: MET    ASSESSMENT:  CLINICAL IMPRESSION:  Patient is a 48 y/o F presenting to OPPT with c/o dizziness for the past month. Denies head trauma, vision changes/double vision, hearing loss, tinnitus, migraines, changes in balance. Vestibular exam was normal with exception of mild dizziness upon sitting up from L [facility] and this could not be replicated. Patient requesting 30 day hold in case of symptom reoccurance.      OBJECTIVE IMPAIRMENTS: dizziness.   ACTIVITY LIMITATIONS: bed mobility  PARTICIPATION LIMITATIONS: none  PERSONAL FACTORS: Age, Past/current experiences, and 1 comorbidity: L foot surgery are also affecting patient's functional outcome.   REHAB POTENTIAL: Good  CLINICAL DECISION MAKING: Evolving/moderate complexity  EVALUATION COMPLEXITY: Moderate   PLAN:  PT FREQUENCY: one time visit  PT DURATION: -  PLANNED INTERVENTIONS: 97110-Therapeutic exercises, 97530- Therapeutic activity, [id number]- Neuromuscular re-education, 97535- Self Care, [id number]- Manual therapy, and Patient/Family education  PLAN FOR NEXT SESSION: 30 day hold at this time     Louana Terrilyn Christians, PT, DPT 12/19/23 4:32 PM  [facility] at [facility] [address] [place], [place] [address] Phone # [phone] Fax # [phone]

## 2023-12-19 ENCOUNTER — Other Ambulatory Visit: Payer: Self-pay

## 2023-12-19 ENCOUNTER — Ambulatory Visit: Attending: Physician Assistant | Admitting: Physical Therapy

## 2023-12-19 ENCOUNTER — Encounter: Payer: Self-pay | Admitting: Physical Therapy

## 2023-12-19 DIAGNOSIS — R42 Dizziness and giddiness: Secondary | ICD-10-CM | POA: Diagnosis not present

## 2024-01-23 ENCOUNTER — Ambulatory Visit: Admitting: Obstetrics and Gynecology

## 2024-01-23 VITALS — BP 112/72 | HR 98 | Ht 61.5 in | Wt 150.0 lb

## 2024-01-23 DIAGNOSIS — Z01818 Encounter for other preprocedural examination: Secondary | ICD-10-CM

## 2024-01-23 DIAGNOSIS — R102 Pelvic and perineal pain unspecified side: Secondary | ICD-10-CM

## 2024-01-23 DIAGNOSIS — N946 Dysmenorrhea, unspecified: Secondary | ICD-10-CM

## 2024-01-23 DIAGNOSIS — N938 Other specified abnormal uterine and vaginal bleeding: Secondary | ICD-10-CM | POA: Diagnosis not present

## 2024-01-23 DIAGNOSIS — D219 Benign neoplasm of connective and other soft tissue, unspecified: Secondary | ICD-10-CM | POA: Diagnosis not present

## 2024-01-23 MED ORDER — IBUPROFEN 800 MG PO TABS: 800.0000 mg | ORAL_TABLET | Freq: Three times a day (TID) | ORAL | 1 refills | Status: AC | PRN

## 2024-01-23 MED ORDER — METOCLOPRAMIDE HCL 10 MG PO TABS: 10.0000 mg | ORAL_TABLET | Freq: Three times a day (TID) | ORAL | 0 refills | Status: AC | PRN

## 2024-01-23 MED ORDER — OXYCODONE HCL 5 MG PO TABS: 5.0000 mg | ORAL_TABLET | ORAL | 0 refills | Status: AC | PRN

## 2024-01-23 NOTE — Progress Notes (Signed)
 "   48 y.o. y.o. female here for preop exam for RLH, bilateral salpingectomy, cystoscopy No LMP recorded. (Menstrual status: Oral contraceptives).   Patient with daily bleeding here today for EMB prior to surgery in January Bleeding stopped at 200mg  of prometrium , but she is feeling tired and has occasional hot flashes and would like to try to go down to 100mg  at bedtime until surgery Reports painful cramping as well. Patient also ilicits urinary symptoms She is frustrated with the daily bleeding She has stopped bleeding with the prometrium  and would like to continue this after surgery to help her sleep.  She will stop the ocp's before surgery.   2020 PUS with 9.54cm uterus with 3 fibroids and largest measuring 3.25cm  05/26/23 PUS with increased size of uterus measuring 16cm with largest fibroid at 7.82cm, 2.53cm, 3.76, 2.49cm.  MMG 07/04/23 with breast US  scheduled Colonoscopy: order placed Last pap smear 11/10/22- she has her annual exam scheduled Ocp's with the prometrium  has stopped the bleeding for now. Lower dose of prometrium , she started bleeding again.  Body mass index is 27.88 kg/m.    Blood pressure 112/72, pulse 98, height 5' 1.5 (1.562 m), weight 150 lb (68 kg), SpO2 100%.     Component Value Date/Time   DIAGPAP (A) 11/17/2023 1610    - Atypical squamous cells of undetermined significance (ASC-US )   DIAGPAP  11/10/2022 0940    - Negative for intraepithelial lesion or malignancy (NILM)   DIAGPAP  02/21/2018 0000    NEGATIVE FOR INTRAEPITHELIAL LESIONS OR MALIGNANCY.   HPVHIGH Negative 11/17/2023 1610   HPVHIGH Negative 11/10/2022 0940   ADEQPAP  11/17/2023 1610    Satisfactory for evaluation; transformation zone component PRESENT.   ADEQPAP  11/10/2022 0940    Satisfactory for evaluation; transformation zone component PRESENT.   ADEQPAP  02/21/2018 0000    Satisfactory for evaluation  endocervical/transformation zone component PRESENT.    GYN HISTORY:     Component Value Date/Time   DIAGPAP (A) 11/17/2023 1610    - Atypical squamous cells of undetermined significance (ASC-US )   DIAGPAP  11/10/2022 0940    - Negative for intraepithelial lesion or malignancy (NILM)   DIAGPAP  02/21/2018 0000    NEGATIVE FOR INTRAEPITHELIAL LESIONS OR MALIGNANCY.   HPVHIGH Negative 11/17/2023 1610   HPVHIGH Negative 11/10/2022 0940   ADEQPAP  11/17/2023 1610    Satisfactory for evaluation; transformation zone component PRESENT.   ADEQPAP  11/10/2022 0940    Satisfactory for evaluation; transformation zone component PRESENT.   ADEQPAP  02/21/2018 0000    Satisfactory for evaluation  endocervical/transformation zone component PRESENT.    OB History  Gravida Para Term Preterm AB Living  0 0 0 0 0 0  SAB IAB Ectopic Multiple Live Births  0 0 0 0 0    Past Medical History:  Diagnosis Date   Fibroid     Past Surgical History:  Procedure Laterality Date   FOOT SURGERY Left     Current Outpatient Medications on File Prior to Visit  Medication Sig Dispense Refill   drospirenone -ethinyl estradiol  (YASMIN ) 3-0.03 MG tablet TAKE 1 TABLET BY MOUTH EVERY DAY 84 tablet 6   fexofenadine (ALLEGRA) 180 MG tablet as needed.     FLUoxetine (PROZAC) 10 MG capsule Take 20 mg by mouth.     loratadine (CLARITIN) 10 MG tablet Take 10 mg by mouth daily.     phentermine (ADIPEX-P) 37.5 MG tablet Take 37.5 mg by mouth daily.  progesterone  (PROMETRIUM ) 100 MG capsule Take 2 capsules (200 mg total) by mouth at bedtime. 30 capsule 0   [DISCONTINUED] Norethindrone  Acetate-Ethinyl Estrad-FE (LOESTRIN 24 FE) 1-20 MG-MCG(24) tablet Take 1 tablet by mouth daily. 3 Package 0   No current facility-administered medications on file prior to visit.    Social History   Socioeconomic History   Marital status: Married    Spouse name: Not on file   Number of children: Not on file   Years of education: Not on file   Highest education level: Not on file  Occupational  History   Not on file  Tobacco Use   Smoking status: Never   Smokeless tobacco: Never  Vaping Use   Vaping status: Never Used  Substance and Sexual Activity   Alcohol  use: Yes    Comment: 1-2/week   Drug use: Never   Sexual activity: Yes    Partners: Male    Birth control/protection: OCP  Other Topics Concern   Not on file  Social History Narrative   Not on file   Social Drivers of Health   Tobacco Use: Low Risk (12/19/2023)   Patient History    Smoking Tobacco Use: Never    Smokeless Tobacco Use: Never    Passive Exposure: Not on file  Financial Resource Strain: Low Risk (03/01/2023)   Received from [facility]   Overall Financial Resource Strain (CARDIA)    Difficulty of Paying Living Expenses: Not hard at all  Food Insecurity: No Food Insecurity (03/01/2023)   Received from [facility]   Epic    Within the past 12 months, you worried that your food would run out before you got the money to buy more.: Never true    Within the past 12 months, the food you bought just didn't last and you didn't have money to get more.: Never true  Transportation Needs: No Transportation Needs (03/01/2023)   Received from [facility] - Transportation    Lack of Transportation (Medical): No    Lack of Transportation (Non-Medical): No  Physical Activity: Insufficiently Active (03/01/2023)   Received from [facility]   Exercise Vital Sign    On average, how many days per week do you engage in moderate to strenuous exercise (like a brisk walk)?: 7 days    On average, how many minutes do you engage in exercise at this level?: 10 min  Stress: No Stress Concern Present (03/01/2023)   Received from [facility] - Occupational Stress Questionnaire    Feeling of Stress : Only a little  Social Connections: Socially Integrated (03/01/2023)   Received from [facility]   Social Network    How would you rate your social network (family,  work, friends)?: Good participation with social networks  Intimate Partner Violence: Not At Risk (03/01/2023)   Received from [facility]   HITS    Over the last 12 months how often did your partner physically hurt you?: Never    Over the last 12 months how often did your partner insult you or talk down to you?: Never    Over the last 12 months how often did your partner threaten you with physical harm?: Never    Over the last 12 months how often did your partner scream or curse at you?: Never  Depression (PHQ2-9): Low Risk (11/17/2023)   Depression (PHQ2-9)    PHQ-2 Score: 0  Alcohol  Screen: Not on file  Housing: Low Risk (  03/01/2023)   Received from [facility]    In the last 12 months, was there a time when you were not able to pay the mortgage or rent on time?: No    In the past 12 months, how many times have you moved where you were living?: 0    At any time in the past 12 months, were you homeless or living in a shelter (including now)?: No  Utilities: Not At Risk (03/01/2023)   Received from [facility]    Threatened with loss of utilities: No  Health Literacy: Not on file    Family History  Problem Relation Age of Onset   Uterine cancer Mother    Cancer Father 63       small cell, stage 4, lung, prostate and bladder     No Known Allergies    Patient's last menstrual period was No LMP recorded. (Menstrual status: Oral contraceptives)..             Review of Systems Alls systems reviewed and are negative.     Physical Exam Constitutional:      Appearance: Normal appearance.  Genitourinary:     Vulva and urethral meatus normal.     No lesions in the vagina.     Genitourinary Comments: 15cm fibroid uterus     Right Labia: No rash, lesions or skin changes.    Left Labia: No lesions, skin changes or rash.    No vaginal discharge or tenderness.     No vaginal prolapse present.    No vaginal atrophy present.     Right Adnexa: not  tender, not palpable and no mass present.    Left Adnexa: not tender, not palpable and no mass present.    No cervical motion tenderness or discharge.     Uterus is enlarged and irregular.     Uterus is not tender.  Breasts:    Right: Normal.     Left: Normal.  HENT:     Head: Normocephalic.  Neck:     Thyroid : No thyroid  mass, thyromegaly or thyroid  tenderness.  Cardiovascular:     Rate and Rhythm: Normal rate and regular rhythm.     Heart sounds: Normal heart sounds, S1 normal and S2 normal.  Pulmonary:     Effort: Pulmonary effort is normal.     Breath sounds: Normal breath sounds and air entry.  Abdominal:     General: There is no distension.     Palpations: Abdomen is soft. There is no mass.     Tenderness: There is no abdominal tenderness. There is no guarding or rebound.  Musculoskeletal:        General: Normal range of motion.     Cervical back: Full passive range of motion without pain, normal range of motion and neck supple. No tenderness.     Right lower leg: No edema.     Left lower leg: No edema.  Neurological:     Mental Status: She is alert.  Skin:    General: Skin is warm.  Psychiatric:        Mood and Affect: Mood normal.        Behavior: Behavior normal.        Thought Content: Thought content normal.  Vitals and nursing note reviewed. Exam conducted with a chaperone present.       A:        Enlarging Fibroid uterus Menorrhagia Dysmenorrhea Preop for RLH,  bilateral salpingectomy, cystoscopy with possible olympus bag with morcellator                              P:         The risks of surgery were discussed in detail with the patient including but not limited to: bleeding which may require transfusion or reoperation; infection which may require prolonged hospitalization or re-hospitalization and antibiotic therapy; injury to bowel, bladder, ureters and major vessels or other surrounding organs which may lead to other procedures; formation of  adhesions; need for additional procedures including laparotomy or subsequent procedures secondary to intraoperative injury or abnormal pathology; thromboembolic phenomenon; incisional problems and other postoperative or anesthesia complications.  The postoperative expectations were also discussed in detail. The patient also understands the alternative treatment options which were discussed in full. All questions were answered.  Patient would like to proceed with the procedure.  30 minutes spent on reviewing records, imaging,  and one on one patient time and counseling patient and documentation Dr. Glennon  No follow-ups on file.  Almarie MARLA Glennon "

## 2024-01-23 NOTE — H&P (View-Only) (Signed)
 "   48 y.o. y.o. female here for preop exam for RLH, bilateral salpingectomy, cystoscopy No LMP recorded. (Menstrual status: Oral contraceptives).   Patient with daily bleeding here today for EMB prior to surgery in January Bleeding stopped at 200mg  of prometrium , but she is feeling tired and has occasional hot flashes and would like to try to go down to 100mg  at bedtime until surgery Reports painful cramping as well. Patient also ilicits urinary symptoms She is frustrated with the daily bleeding She has stopped bleeding with the prometrium  and would like to continue this after surgery to help her sleep.  She will stop the ocp's before surgery.   2020 PUS with 9.54cm uterus with 3 fibroids and largest measuring 3.25cm  05/26/23 PUS with increased size of uterus measuring 16cm with largest fibroid at 7.82cm, 2.53cm, 3.76, 2.49cm.  MMG 07/04/23 with breast US  scheduled Colonoscopy: order placed Last pap smear 11/10/22- she has her annual exam scheduled Ocp's with the prometrium  has stopped the bleeding for now. Lower dose of prometrium , she started bleeding again.  Body mass index is 27.88 kg/m.    Blood pressure 112/72, pulse 98, height 5' 1.5 (1.562 m), weight 150 lb (68 kg), SpO2 100%.     Component Value Date/Time   DIAGPAP (A) 11/17/2023 1610    - Atypical squamous cells of undetermined significance (ASC-US )   DIAGPAP  11/10/2022 0940    - Negative for intraepithelial lesion or malignancy (NILM)   DIAGPAP  02/21/2018 0000    NEGATIVE FOR INTRAEPITHELIAL LESIONS OR MALIGNANCY.   HPVHIGH Negative 11/17/2023 1610   HPVHIGH Negative 11/10/2022 0940   ADEQPAP  11/17/2023 1610    Satisfactory for evaluation; transformation zone component PRESENT.   ADEQPAP  11/10/2022 0940    Satisfactory for evaluation; transformation zone component PRESENT.   ADEQPAP  02/21/2018 0000    Satisfactory for evaluation  endocervical/transformation zone component PRESENT.    GYN HISTORY:     Component Value Date/Time   DIAGPAP (A) 11/17/2023 1610    - Atypical squamous cells of undetermined significance (ASC-US )   DIAGPAP  11/10/2022 0940    - Negative for intraepithelial lesion or malignancy (NILM)   DIAGPAP  02/21/2018 0000    NEGATIVE FOR INTRAEPITHELIAL LESIONS OR MALIGNANCY.   HPVHIGH Negative 11/17/2023 1610   HPVHIGH Negative 11/10/2022 0940   ADEQPAP  11/17/2023 1610    Satisfactory for evaluation; transformation zone component PRESENT.   ADEQPAP  11/10/2022 0940    Satisfactory for evaluation; transformation zone component PRESENT.   ADEQPAP  02/21/2018 0000    Satisfactory for evaluation  endocervical/transformation zone component PRESENT.    OB History  Gravida Para Term Preterm AB Living  0 0 0 0 0 0  SAB IAB Ectopic Multiple Live Births  0 0 0 0 0    Past Medical History:  Diagnosis Date   Fibroid     Past Surgical History:  Procedure Laterality Date   FOOT SURGERY Left     Current Outpatient Medications on File Prior to Visit  Medication Sig Dispense Refill   drospirenone -ethinyl estradiol  (YASMIN ) 3-0.03 MG tablet TAKE 1 TABLET BY MOUTH EVERY DAY 84 tablet 6   fexofenadine (ALLEGRA) 180 MG tablet as needed.     FLUoxetine (PROZAC) 10 MG capsule Take 20 mg by mouth.     loratadine (CLARITIN) 10 MG tablet Take 10 mg by mouth daily.     phentermine (ADIPEX-P) 37.5 MG tablet Take 37.5 mg by mouth daily.  progesterone  (PROMETRIUM ) 100 MG capsule Take 2 capsules (200 mg total) by mouth at bedtime. 30 capsule 0   [DISCONTINUED] Norethindrone  Acetate-Ethinyl Estrad-FE (LOESTRIN 24 FE) 1-20 MG-MCG(24) tablet Take 1 tablet by mouth daily. 3 Package 0   No current facility-administered medications on file prior to visit.    Social History   Socioeconomic History   Marital status: Married    Spouse name: Not on file   Number of children: Not on file   Years of education: Not on file   Highest education level: Not on file  Occupational  History   Not on file  Tobacco Use   Smoking status: Never   Smokeless tobacco: Never  Vaping Use   Vaping status: Never Used  Substance and Sexual Activity   Alcohol  use: Yes    Comment: 1-2/week   Drug use: Never   Sexual activity: Yes    Partners: Male    Birth control/protection: OCP  Other Topics Concern   Not on file  Social History Narrative   Not on file   Social Drivers of Health   Tobacco Use: Low Risk (12/19/2023)   Patient History    Smoking Tobacco Use: Never    Smokeless Tobacco Use: Never    Passive Exposure: Not on file  Financial Resource Strain: Low Risk (03/01/2023)   Received from [facility]   Overall Financial Resource Strain (CARDIA)    Difficulty of Paying Living Expenses: Not hard at all  Food Insecurity: No Food Insecurity (03/01/2023)   Received from [facility]   Epic    Within the past 12 months, you worried that your food would run out before you got the money to buy more.: Never true    Within the past 12 months, the food you bought just didn't last and you didn't have money to get more.: Never true  Transportation Needs: No Transportation Needs (03/01/2023)   Received from [facility] - Transportation    Lack of Transportation (Medical): No    Lack of Transportation (Non-Medical): No  Physical Activity: Insufficiently Active (03/01/2023)   Received from [facility]   Exercise Vital Sign    On average, how many days per week do you engage in moderate to strenuous exercise (like a brisk walk)?: 7 days    On average, how many minutes do you engage in exercise at this level?: 10 min  Stress: No Stress Concern Present (03/01/2023)   Received from [facility] - Occupational Stress Questionnaire    Feeling of Stress : Only a little  Social Connections: Socially Integrated (03/01/2023)   Received from [facility]   Social Network    How would you rate your social network (family,  work, friends)?: Good participation with social networks  Intimate Partner Violence: Not At Risk (03/01/2023)   Received from [facility]   HITS    Over the last 12 months how often did your partner physically hurt you?: Never    Over the last 12 months how often did your partner insult you or talk down to you?: Never    Over the last 12 months how often did your partner threaten you with physical harm?: Never    Over the last 12 months how often did your partner scream or curse at you?: Never  Depression (PHQ2-9): Low Risk (11/17/2023)   Depression (PHQ2-9)    PHQ-2 Score: 0  Alcohol  Screen: Not on file  Housing: Low Risk (  03/01/2023)   Received from [facility]    In the last 12 months, was there a time when you were not able to pay the mortgage or rent on time?: No    In the past 12 months, how many times have you moved where you were living?: 0    At any time in the past 12 months, were you homeless or living in a shelter (including now)?: No  Utilities: Not At Risk (03/01/2023)   Received from [facility]    Threatened with loss of utilities: No  Health Literacy: Not on file    Family History  Problem Relation Age of Onset   Uterine cancer Mother    Cancer Father 63       small cell, stage 4, lung, prostate and bladder     No Known Allergies    Patient's last menstrual period was No LMP recorded. (Menstrual status: Oral contraceptives)..             Review of Systems Alls systems reviewed and are negative.     Physical Exam Constitutional:      Appearance: Normal appearance.  Genitourinary:     Vulva and urethral meatus normal.     No lesions in the vagina.     Genitourinary Comments: 15cm fibroid uterus     Right Labia: No rash, lesions or skin changes.    Left Labia: No lesions, skin changes or rash.    No vaginal discharge or tenderness.     No vaginal prolapse present.    No vaginal atrophy present.     Right Adnexa: not  tender, not palpable and no mass present.    Left Adnexa: not tender, not palpable and no mass present.    No cervical motion tenderness or discharge.     Uterus is enlarged and irregular.     Uterus is not tender.  Breasts:    Right: Normal.     Left: Normal.  HENT:     Head: Normocephalic.  Neck:     Thyroid : No thyroid  mass, thyromegaly or thyroid  tenderness.  Cardiovascular:     Rate and Rhythm: Normal rate and regular rhythm.     Heart sounds: Normal heart sounds, S1 normal and S2 normal.  Pulmonary:     Effort: Pulmonary effort is normal.     Breath sounds: Normal breath sounds and air entry.  Abdominal:     General: There is no distension.     Palpations: Abdomen is soft. There is no mass.     Tenderness: There is no abdominal tenderness. There is no guarding or rebound.  Musculoskeletal:        General: Normal range of motion.     Cervical back: Full passive range of motion without pain, normal range of motion and neck supple. No tenderness.     Right lower leg: No edema.     Left lower leg: No edema.  Neurological:     Mental Status: She is alert.  Skin:    General: Skin is warm.  Psychiatric:        Mood and Affect: Mood normal.        Behavior: Behavior normal.        Thought Content: Thought content normal.  Vitals and nursing note reviewed. Exam conducted with a chaperone present.       A:        Enlarging Fibroid uterus Menorrhagia Dysmenorrhea Preop for RLH,  bilateral salpingectomy, cystoscopy with possible olympus bag with morcellator                              P:         The risks of surgery were discussed in detail with the patient including but not limited to: bleeding which may require transfusion or reoperation; infection which may require prolonged hospitalization or re-hospitalization and antibiotic therapy; injury to bowel, bladder, ureters and major vessels or other surrounding organs which may lead to other procedures; formation of  adhesions; need for additional procedures including laparotomy or subsequent procedures secondary to intraoperative injury or abnormal pathology; thromboembolic phenomenon; incisional problems and other postoperative or anesthesia complications.  The postoperative expectations were also discussed in detail. The patient also understands the alternative treatment options which were discussed in full. All questions were answered.  Patient would like to proceed with the procedure.  30 minutes spent on reviewing records, imaging,  and one on one patient time and counseling patient and documentation Dr. Glennon  No follow-ups on file.  Mckenzie Maldonado "

## 2024-01-23 NOTE — Patient Instructions (Signed)
 Robotic Laparoscopic Hysterectomy, Care After  The following information offers guidance on how to care for yourself after your procedure. Your health care provider may also give you more specific instructions. If you have problems or questions, contact your health care provider. What can I expect after the procedure? After the procedure, it is common to have: Pain, bruising, and numbness around your incisions. Tiredness (fatigue).  Abdominal bloating Poor appetite. Chest discomfort that radiates to your shoulder from the carbon dioxide gas for a few days after  Vaginal discharge or spotting. You will need to use a sanitary pad after this procedure.  HEAVY BLEEDING LIKE A PERIOD IS NOT NORMAL.  PLEASE CALL YOUR PROVIDER IF SOAKING A PAD or have copious discharge and or pain.  Feelings of sadness or other emotions.  If your ovaries were also removed, it is also common to have symptoms of menopause, such as hot flashes, night sweats, and lack of sleep (insomnia).  Ovaries should stay in if at all possible until at least the age of 62. Follow these instructions at home: Medicines Take over-the-counter and prescription medicines only as told by your health care provider. Ask your health care provider if the medicine prescribed to you: Requires you to avoid driving or using machinery. You cannot drive for 24 hours after anesthesia Can cause constipation. You may need to take these actions to prevent or treat constipation: Drink enough fluid to keep your urine pale yellow. Take over-the-counter or prescription medicines. Eat foods that are high in fiber, such as beans, whole grains, and fresh fruits and vegetables. Limit foods that are high in fat and processed sugars, such as fried or sweet foods.  Also, avoid spicy foods.  NAUSEA IS COMMON THE FIRST NIGHT OF SURGERY.  IF IT LASTS BEYOND 24 HOURS, CALL YOUR PROVIDER.  NAUSEA MEDICATION WAS GIVEN AT YOUR PREOP APPOINTMENT THAT YOU CAN TAKE  AFTER SURGERY. Incision care  Follow instructions from your health care provider about how to take care of your incisions. Make sure you: LEAVE INCISION OPEN AND DRY-NO BANDAGES Leave stitches (sutures), skin glue, or adhesive strips in place UNTIL 2 WEEKS THEN REMOVE IN THE SHOWER.  If adhesive strip edges start to loosen and curl up, you may trim the loose edges. Check your incision areas every day for signs of infection. Check for: More redness, swelling, or pain. Fluid or blood. Warmth. Pus or a bad smell. Activity  Rest as told by your health care provider. Avoid sitting for a long time without moving. Get up to take short walks every 1-2 hours. This is important to improve blood flow and breathing. Ask for help if you feel weak or unsteady.  If you are sore or tired, rest. Return to your normal activities as told by your health care provider. Ask your health care provider what activities are safe for you. Do not lift, push or pull anything that is heavier than 13 lb (4.5 kg), or the limit that you are told, for 6 WEEKS after surgery or until your health care provider says that it is safe. If you were given a sedative during the procedure, it can affect you for several hours. Do not drive or operate machinery until your health care provider says that it is safe. Lifestyle Do not use any products that contain nicotine or tobacco. These products include cigarettes, chewing tobacco, and vaping devices, such as e-cigarettes. These can delay healing after surgery. If you need help quitting, ask your health care provider.  Do not drink alcohol until your health care provider approves. Take a daily multivitamin and keep a high protein diet for wound healing  DO NOT HAVE INTERCOURSE UNTIL YOU ARE INSTRUCTED THAT IT IS SAFE TO DO SO  Post operative appointments need to be scheduled at 2, 6 and 10 weeks.  You can come anytime before these with any concerns.   Discussed and reviewed with patient  risks with early intercourse or use of any foreign objects (externally or internally) can increase your risk including but not limited to the risk of vaginal cuff separation and or infection, risks for bowel involvement, risk for emergent surgery, and hospital admission with need for antibiotics.  Discussed in cases with cuff separation and bowel involvement there may be the need for colostomy placement as well.  In no situation should she have intercourse unless cleared to do so.  This can be anywhere from 10 weeks or longer after surgery.  General instructions YOU MAY TAKE SHOWERS ONLY FOR 2 WEEKS AFTER SURGERY, THEN YOU MAY USE TUBS AND HOT TUBS OR SWIM AFTER THAT Do not douche, use tampons, or have sex for at least 10 weeks, or possibly longer. You will need to have an exam done in the office to be cleared to have intercourse. If you struggle with physical or emotional changes after your procedure, speak with your health care provider or a therapist.  IF YOU HAVE BURNING WITH URINATION, PLEASE CALL YOUR DOCTOR. BLADDER INFECTIONS MAY OCCUR AFTER SURGERY Try to have someone at home with you for the first week to help with your daily chores.  Most patients are driving by the end of the first week after the robotic hysterectomy. Wear compression stockings as told by your health care provider. These stockings help to prevent blood clots and reduce swelling in your legs. Keep all follow-up visits. This is important. Contact a health care provider if: You have any of these signs of infection: Chills or a fever 148f OR GREATER. More redness, swelling, or pain around an incision. Fluid or blood coming from an incision. Warmth coming from an incision. Pus or a bad smell coming from an incision. Burning with urination. Urinary frequency or cramping.   IF YOU HAVE THESE SYMPTOMS, PLEASE CALL THE OFFICE TO COME EVALUATE FOR A BLADDER INFECTION AT [phone] An incision opens. You feel dizzy or  light-headed. You have pain or bleeding when you urinate, or you are unable to urinate. You have abnormal vaginal discharge. You have pain that does not get better with medicine. Get help right away if: You have a fever and your symptoms suddenly get worse. You have severe abdominal pain. Heavy vaginal bleeding, like a period You have chest pain or shortness of breath. You may have chest pain and shortness of breath from the CO2 gas for a few days after surgery.  This is very common.  Walking, Gas-X and motrin  will usually help relieve this discomfort You faint. You have pain, swelling, or redness in your leg.  These symptoms may represent a serious problem that is an emergency. Do not wait to see if the symptoms will go away. Get medical help right away. Call your local emergency services (911 in the [place] Do not drive yourself to the hospital. Summary  CONSTIPATION MEDICATION AFTER SURGERY: COLACE, MOM, MIRALAX , GAS X are all helpful to have on hand, if needed.  FILL ALL POSTOP MEDICATION BEFORE SURGERY   [email or web address] is a nice blog site for women preparing for the  robotic hysterectomy

## 2024-02-09 ENCOUNTER — Encounter (HOSPITAL_COMMUNITY): Payer: Self-pay | Admitting: Obstetrics and Gynecology

## 2024-02-09 ENCOUNTER — Encounter: Payer: Self-pay | Admitting: *Deleted

## 2024-02-09 NOTE — Progress Notes (Signed)
 Surgical Instructions  Your procedure is scheduled on :  Friday February 6th, 2026 Report to [facility] Main Entrance A at 5:30 AM, then check in the Admitting office. Any questions or running late day of surgery :  call [phone]  Questions prior to your surgery day:  call [phone], Monday -- Friday 8am - 4pm. If you experience any cold or flu symptoms such as cough, fever, chills, shortness of breath, etc. between now and you scheduled surgery, please notify your surgeon office.   Remember: Do Not eat any food after midnight the night before surgery.     This includes No water,  candy,  gum, and mints.  Take these medicines the morning of surgery with A SIPS OF WATER:  Yasmin  and Prozac.    One week prior to surgery, STOP taking any Aspirin  (unless otherwise instructed by your surgeon) Aleve, Naproxen, ibuprofen , Motrin , Advil , Goody's, BC's, all herbal medications/ supplements, fish oil, and non-prescription vitamins.  Do NOT Smoke (tobacco/ vaping) and Do Not drink alcohol  for 24 hours prior to your procedure.  For those patients that use a CPAP.  Please bring your CPAP/ mask/ tubing with them day of surgery . Anesthesia may ask recovery room nurse to use and if you stay the night you be asked to use it.  You will be asked to removed any contacts, glasses, piercing's, hearing aid's, dentures/ partials prior to surgery.  Please bring cases/ container/ solution/ etc., for them day of surgery.   Patients discharged the day of surgery will NOT be allowed to drive home.  You must have responsible driver and caregiver to stay at home with you the next 24 hours.  SURGICAL WAITING ROOM VISITATION Patients may have no more than 2 support people in the waiting area - if more than 2 , these visitors may rotate.  Pre-op nurse will coordinate an appropriate time for 1 Adult support person, who may not rotate, to accompany patient in pre-op.  Aware some patients may have certain  circumstances, speak to pre-op nurse day of surgery.  Children under the age 47 must have an adult with them who is not the patient and must remain in the main waiting area with an adult.  If the patient needs to stay at the hospital during part of their recovery, the visitor guidelines for inpatient rooms apply.  Please refer to the [facility] website for the visitor guidelines for any additional information.  If you received a COVID test during your pre-op visit it is requested that you wear a mask when out in public, stay away from anyone that may not be feeling well and notify your surgeon if you develop symptoms.  If you have been in contact with anyone that has tested positive in the past 10 days notify your surgeon.     [facility] - Preparing for Surgery  Before surgery, you can play an important role. Because skin is not sterile, it needs to be as free of germs as possible. You can reduce the number of germs on your skin by washing with CHG (chlorhexidine gluconate) soap before surgery. CHG is an antiseptic cleaner which kills germs and bonds with the skin to continue killing germs even after washing. Oral hygiene is also important in reducing the risk of infection. Remember to brush your teeth with your regular toothpaste the morning of surgery.  Please DO NOT use if you have an allergy to CHG or antibacterial soaps. If your skin becomes reddened/irritated stop using  the CHG and inform your Pre-op nurse day of surgery.  DO NOT shave (including legs and genital area) for at least 48 hours prior to your CHG shower.   Please follow these instructions carefully:  Shower with CHG soap the night before surgery. If you choose to wash your hair, wash your hair first as usual with your normal shampoo. After you shampoo, rinse your hair and body thoroughly to remove the shampoo. Use CHG as you would any other liquid soap. You can apply CHG directly to the skin and wash gently with a clean  washcloth or shower sponge. Apply the CHG soap to your body ONLY FROM THE NECK DOWN. Do not use on open wounds or open sores. Avoid contact with your eyes, ears, mouth, and genitals (private parts). Wash genitals (private parts) with your normal soap. Wash thoroughly, paying special attention to the area where your surgery will be performed. Thoroughly rinse your body with warm water from the neck down. DO NOT shower/wash with your normal soap after using and rinsing off the CHG soap. DO NOT use lotions, oils, etc., after showering with CHG. Pat yourself dry with a clean towel. Wear clean pajamas. Place clean sheets on your bed the night of your CHG shower and do not sleep with pets.  Day of Surgery  DO NOT Apply any lotions,  powder,  oils,  deodorants (may use underarm deodorant),  cologne/  perfumes  or makeup Do Not wear jewelry /  piercing's/  metal/  permanent jewelry must be removed prior to arrival day of surgery. (No plastic piercing) Do Not wear nail polish,  gel polish,  artificial nails, or any other type of covering on natural finger nails (toe nails are okay) Remember to brush your teeth and rinse mouth out. Put on clean / comfortable clothes. [facility] is not responsible for valuables/ personal belongings

## 2024-02-09 NOTE — Progress Notes (Signed)
 Spoke w/ via phone for pre-op interview--- [employer]---- CBC and T&S per protocol. Lab appt 02/15/24 at 0930. Surgeon orders requested 02/09/24. UPT per anesthesia.         Lab results------ COVID test -----patient states asymptomatic no test needed Arrive at -------0530 NPO after MN NO Solid Food.   Pre-Surgery Ensure or G2:  Med rec completed Medications to take morning of surgery -----Yasmin  and Prozac. Diabetic medication -----  GLP1 agonist last dose: GLP1 instructions:  Patient instructed no nail polish to be worn day of surgery Patient instructed to bring photo id and insurance card day of surgery Patient aware to have Driver (ride ) / caregiver    for 24 hours after surgery - Husband Renarda Mullinix Patient Special Instructions ----- Hold Phentermine at least 24hrs prior to surgery. CHG shower night before surgery. Pre-Op special Instructions -----  Patient verbalized understanding of instructions that were given at this phone interview. Patient denies chest pain, sob, fever, cough at the interview.

## 2024-02-14 ENCOUNTER — Ambulatory Visit: Payer: Self-pay | Admitting: Obstetrics and Gynecology

## 2024-02-14 ENCOUNTER — Encounter (HOSPITAL_COMMUNITY)
Admission: RE | Admit: 2024-02-14 | Discharge: 2024-02-14 | Disposition: A | Source: Ambulatory Visit | Attending: Obstetrics and Gynecology

## 2024-02-14 DIAGNOSIS — Z01818 Encounter for other preprocedural examination: Secondary | ICD-10-CM

## 2024-02-14 DIAGNOSIS — Z01812 Encounter for preprocedural laboratory examination: Secondary | ICD-10-CM | POA: Insufficient documentation

## 2024-02-14 LAB — TYPE AND SCREEN
ABO/RH(D): O POS
Antibody Screen: NEGATIVE

## 2024-02-14 LAB — CBC
HCT: 41.9 % (ref 36.0–46.0)
Hemoglobin: 14.6 g/dL (ref 12.0–15.0)
MCH: 30.4 pg (ref 26.0–34.0)
MCHC: 34.8 g/dL (ref 30.0–36.0)
MCV: 87.3 fL (ref 80.0–100.0)
Platelets: 259 10*3/uL (ref 150–400)
RBC: 4.8 MIL/uL (ref 3.87–5.11)
RDW: 13.1 % (ref 11.5–15.5)
WBC: 10.3 10*3/uL (ref 4.0–10.5)
nRBC: 0 % (ref 0.0–0.2)

## 2024-02-16 MED ORDER — SODIUM CHLORIDE 0.9 % IV SOLN
INTRAVENOUS | Status: DC
  Filled 2024-02-16: qty 10

## 2024-02-17 ENCOUNTER — Encounter (HOSPITAL_COMMUNITY): Admission: RE | Disposition: A | Payer: Self-pay | Source: Home / Self Care | Attending: Obstetrics and Gynecology

## 2024-02-17 ENCOUNTER — Ambulatory Visit (HOSPITAL_COMMUNITY)

## 2024-02-17 ENCOUNTER — Encounter (HOSPITAL_COMMUNITY): Payer: Self-pay | Admitting: Obstetrics and Gynecology

## 2024-02-17 ENCOUNTER — Ambulatory Visit (HOSPITAL_COMMUNITY)
Admission: RE | Admit: 2024-02-17 | Discharge: 2024-02-17 | Disposition: A | Source: Home / Self Care | Attending: Obstetrics and Gynecology | Admitting: Obstetrics and Gynecology

## 2024-02-17 DIAGNOSIS — R102 Pelvic and perineal pain unspecified side: Secondary | ICD-10-CM

## 2024-02-17 DIAGNOSIS — N946 Dysmenorrhea, unspecified: Secondary | ICD-10-CM

## 2024-02-17 DIAGNOSIS — N938 Other specified abnormal uterine and vaginal bleeding: Secondary | ICD-10-CM

## 2024-02-17 DIAGNOSIS — Z01818 Encounter for other preprocedural examination: Secondary | ICD-10-CM

## 2024-02-17 DIAGNOSIS — D219 Benign neoplasm of connective and other soft tissue, unspecified: Secondary | ICD-10-CM

## 2024-02-17 HISTORY — DX: Depression, unspecified: F32.A

## 2024-02-17 HISTORY — DX: Anxiety disorder, unspecified: F41.9

## 2024-02-17 LAB — ABO/RH: ABO/RH(D): O POS

## 2024-02-17 LAB — POCT PREGNANCY, URINE: Preg Test, Ur: NEGATIVE

## 2024-02-17 MED ORDER — ACETAMINOPHEN 500 MG PO TABS: 1000.0000 mg | ORAL_TABLET | ORAL | Status: AC

## 2024-02-17 MED ORDER — KETOROLAC TROMETHAMINE 15 MG/ML IJ SOLN
INTRAMUSCULAR | Status: DC | PRN
  Administered 2024-02-17: 15 mg via INTRAVENOUS

## 2024-02-17 MED ORDER — FENTANYL CITRATE (PF) 100 MCG/2ML IJ SOLN
INTRAMUSCULAR | Status: AC
  Filled 2024-02-17: qty 2

## 2024-02-17 MED ORDER — PHENAZOPYRIDINE HCL 200 MG PO TABS: 200.0000 mg | ORAL_TABLET | Freq: Three times a day (TID) | ORAL | 0 refills | Status: AC | PRN

## 2024-02-17 MED ORDER — ONDANSETRON HCL 4 MG/2ML IJ SOLN
INTRAMUSCULAR | Status: AC
  Filled 2024-02-17: qty 2

## 2024-02-17 MED ORDER — DEXAMETHASONE SOD PHOSPHATE PF 10 MG/ML IJ SOLN
INTRAMUSCULAR | Status: AC
  Filled 2024-02-17: qty 1

## 2024-02-17 MED ORDER — ORAL CARE MOUTH RINSE: 15.0000 mL | Freq: Once | OROMUCOSAL | Status: AC

## 2024-02-17 MED ORDER — OXYCODONE HCL 5 MG PO TABS: 5.0000 mg | ORAL_TABLET | Freq: Once | ORAL | Status: DC | PRN

## 2024-02-17 MED ORDER — FLUORESCEIN SODIUM 10 % IV SOLN
INTRAVENOUS | Status: AC
  Filled 2024-02-17: qty 5

## 2024-02-17 MED ORDER — 0.9 % SODIUM CHLORIDE (POUR BTL) OPTIME
TOPICAL | Status: DC | PRN
  Administered 2024-02-17: 1000 mL

## 2024-02-17 MED ORDER — ROCURONIUM BROMIDE 10 MG/ML (PF) SYRINGE
PREFILLED_SYRINGE | INTRAVENOUS | Status: DC | PRN
  Administered 2024-02-17: 30 mg via INTRAVENOUS
  Administered 2024-02-17 (×2): 10 mg via INTRAVENOUS
  Administered 2024-02-17: 60 mg via INTRAVENOUS

## 2024-02-17 MED ORDER — CEPHALEXIN 500 MG PO CAPS: 500.0000 mg | ORAL_CAPSULE | Freq: Two times a day (BID) | ORAL | 0 refills | Status: AC

## 2024-02-17 MED ORDER — CHLORHEXIDINE GLUCONATE 0.12 % MT SOLN
OROMUCOSAL | Status: AC
  Administered 2024-02-17: 15 mL via OROMUCOSAL
  Filled 2024-02-17: qty 15

## 2024-02-17 MED ORDER — METHYLENE BLUE 20 MG/2ML IV SOSY
PREFILLED_SYRINGE | INTRAVENOUS | Status: AC
  Filled 2024-02-17: qty 2

## 2024-02-17 MED ORDER — DEXAMETHASONE SOD PHOSPHATE PF 10 MG/ML IJ SOLN
INTRAMUSCULAR | Status: DC | PRN
  Administered 2024-02-17: 10 mg via INTRAVENOUS

## 2024-02-17 MED ORDER — SUGAMMADEX SODIUM 200 MG/2ML IV SOLN
INTRAVENOUS | Status: DC | PRN
  Administered 2024-02-17: 200 mg via INTRAVENOUS

## 2024-02-17 MED ORDER — LACTATED RINGERS IV SOLN: INTRAVENOUS | Status: DC

## 2024-02-17 MED ORDER — PHENAZOPYRIDINE HCL 100 MG PO TABS
ORAL_TABLET | ORAL | Status: AC
  Filled 2024-02-17: qty 2

## 2024-02-17 MED ORDER — MIDAZOLAM HCL (PF) 2 MG/2ML IJ SOLN
INTRAMUSCULAR | Status: DC | PRN
  Administered 2024-02-17: 2 mg via INTRAVENOUS

## 2024-02-17 MED ORDER — SODIUM CHLORIDE 0.9 % IV SOLN
2.0000 g | INTRAVENOUS | Status: AC
  Administered 2024-02-17: 2 g via INTRAVENOUS
  Filled 2024-02-17: qty 2

## 2024-02-17 MED ORDER — PHENAZOPYRIDINE HCL 200 MG PO TABS: 200.0000 mg | ORAL_TABLET | Freq: Three times a day (TID) | ORAL | Status: DC

## 2024-02-17 MED ORDER — KETOROLAC TROMETHAMINE 30 MG/ML IJ SOLN
INTRAMUSCULAR | Status: AC
  Filled 2024-02-17: qty 1

## 2024-02-17 MED ORDER — ONDANSETRON HCL 4 MG/2ML IJ SOLN
INTRAMUSCULAR | Status: DC | PRN
  Administered 2024-02-17: 4 mg via INTRAVENOUS

## 2024-02-17 MED ORDER — CHLORHEXIDINE GLUCONATE 0.12 % MT SOLN: 15.0000 mL | Freq: Once | OROMUCOSAL | Status: AC

## 2024-02-17 MED ORDER — ESTRADIOL 0.01 % VA CREA
TOPICAL_CREAM | VAGINAL | Status: DC | PRN
  Administered 2024-02-17: 1 via VAGINAL

## 2024-02-17 MED ORDER — FLUCONAZOLE 150 MG PO TABS: 150.0000 mg | ORAL_TABLET | Freq: Every day | ORAL | 2 refills | Status: AC

## 2024-02-17 MED ORDER — ESTRADIOL 0.01 % VA CREA
TOPICAL_CREAM | VAGINAL | Status: AC
  Filled 2024-02-17: qty 42.5

## 2024-02-17 MED ORDER — OXYCODONE HCL 5 MG/5ML PO SOLN: 5.0000 mg | Freq: Once | ORAL | Status: DC | PRN

## 2024-02-17 MED ORDER — METRONIDAZOLE 500 MG/100ML IV SOLN
INTRAVENOUS | Status: AC
  Filled 2024-02-17: qty 100

## 2024-02-17 MED ORDER — FENTANYL CITRATE (PF) 100 MCG/2ML IJ SOLN
25.0000 ug | INTRAMUSCULAR | Status: DC | PRN
  Administered 2024-02-17: 50 ug via INTRAVENOUS

## 2024-02-17 MED ORDER — ACETAMINOPHEN 10 MG/ML IV SOLN: 1000.0000 mg | Freq: Once | INTRAVENOUS | Status: DC | PRN

## 2024-02-17 MED ORDER — SCOPOLAMINE 1 MG/3DAYS TD PT72
MEDICATED_PATCH | TRANSDERMAL | Status: AC
  Filled 2024-02-17: qty 1

## 2024-02-17 MED ORDER — PHENYLEPHRINE 80 MCG/ML (10ML) SYRINGE FOR IV PUSH (FOR BLOOD PRESSURE SUPPORT)
PREFILLED_SYRINGE | INTRAVENOUS | Status: DC | PRN
  Administered 2024-02-17 (×3): 80 ug via INTRAVENOUS

## 2024-02-17 MED ORDER — SCOPOLAMINE 1 MG/3DAYS TD PT72
1.0000 | MEDICATED_PATCH | TRANSDERMAL | Status: DC
  Administered 2024-02-17: 1 mg via TRANSDERMAL
  Filled 2024-02-17: qty 1

## 2024-02-17 MED ORDER — SODIUM CHLORIDE 0.9 % IV SOLN
INTRAVENOUS | Status: DC | PRN
  Administered 2024-02-17: 1000 mL

## 2024-02-17 MED ORDER — TOLTERODINE TARTRATE ER 4 MG PO CP24: 4.0000 mg | ORAL_CAPSULE | Freq: Every day | ORAL | 0 refills | Status: AC

## 2024-02-17 MED ORDER — PHENAZOPYRIDINE HCL 200 MG PO TABS
200.0000 mg | ORAL_TABLET | Freq: Three times a day (TID) | ORAL | Status: DC
  Administered 2024-02-17: 200 mg via ORAL
  Filled 2024-02-17: qty 1

## 2024-02-17 MED ORDER — PROPOFOL 10 MG/ML IV BOLUS
INTRAVENOUS | Status: DC | PRN
  Administered 2024-02-17: 200 mg via INTRAVENOUS
  Administered 2024-02-17: 25 ug/kg/min via INTRAVENOUS

## 2024-02-17 MED ORDER — PROPOFOL 10 MG/ML IV BOLUS
INTRAVENOUS | Status: AC
  Filled 2024-02-17: qty 20

## 2024-02-17 MED ORDER — METHYLENE BLUE 20 MG/2ML IV SOSY
PREFILLED_SYRINGE | INTRAVENOUS | Status: DC | PRN
  Administered 2024-02-17: 2 mL

## 2024-02-17 MED ORDER — BUPIVACAINE HCL (PF) 0.5 % IJ SOLN
INTRAMUSCULAR | Status: AC
  Filled 2024-02-17: qty 60

## 2024-02-17 MED ORDER — LIDOCAINE 2% (20 MG/ML) 5 ML SYRINGE
INTRAMUSCULAR | Status: AC
  Filled 2024-02-17: qty 5

## 2024-02-17 MED ORDER — ACETAMINOPHEN 500 MG PO TABS
ORAL_TABLET | ORAL | Status: AC
  Administered 2024-02-17: 1000 mg via ORAL
  Filled 2024-02-17: qty 2

## 2024-02-17 MED ORDER — OXYBUTYNIN CHLORIDE 5 MG PO TABS
ORAL_TABLET | ORAL | Status: AC
  Filled 2024-02-17: qty 1

## 2024-02-17 MED ORDER — SODIUM CHLORIDE 0.9 % IV SOLN
INTRAVENOUS | Status: AC
  Filled 2024-02-17: qty 2

## 2024-02-17 MED ORDER — GLYCOPYRROLATE PF 0.2 MG/ML IJ SOSY
PREFILLED_SYRINGE | INTRAMUSCULAR | Status: AC
  Filled 2024-02-17: qty 1

## 2024-02-17 MED ORDER — MIDAZOLAM HCL 2 MG/2ML IJ SOLN
INTRAMUSCULAR | Status: AC
  Filled 2024-02-17: qty 2

## 2024-02-17 MED ORDER — METRONIDAZOLE 500 MG/100ML IV SOLN
500.0000 mg | Freq: Once | INTRAVENOUS | Status: AC
  Administered 2024-02-17: 500 mg via INTRAVENOUS
  Filled 2024-02-17: qty 100

## 2024-02-17 MED ORDER — LIDOCAINE 2% (20 MG/ML) 5 ML SYRINGE
INTRAMUSCULAR | Status: DC | PRN
  Administered 2024-02-17: 100 mg via INTRAVENOUS

## 2024-02-17 MED ORDER — GLYCOPYRROLATE PF 0.2 MG/ML IJ SOSY
PREFILLED_SYRINGE | INTRAMUSCULAR | Status: DC | PRN
  Administered 2024-02-17: .2 mg via INTRAVENOUS

## 2024-02-17 MED ORDER — ROCURONIUM BROMIDE 10 MG/ML (PF) SYRINGE
PREFILLED_SYRINGE | INTRAVENOUS | Status: AC
  Filled 2024-02-17: qty 10

## 2024-02-17 MED ORDER — ALBUMIN HUMAN 5 % IV SOLN: INTRAVENOUS | Status: DC | PRN

## 2024-02-17 MED ORDER — PHENYLEPHRINE HCL-NACL 20-0.9 MG/250ML-% IV SOLN
INTRAVENOUS | Status: DC | PRN
  Administered 2024-02-17: 15 ug/min via INTRAVENOUS

## 2024-02-17 MED ORDER — OXYBUTYNIN CHLORIDE 5 MG PO TABS
5.0000 mg | ORAL_TABLET | Freq: Once | ORAL | Status: AC
  Administered 2024-02-17: 5 mg via ORAL

## 2024-02-17 MED ORDER — BUPIVACAINE HCL (PF) 0.5 % IJ SOLN
INTRAMUSCULAR | Status: DC | PRN
  Administered 2024-02-17: 20 mL

## 2024-02-17 MED ORDER — POVIDONE-IODINE 10 % EX SWAB
2.0000 | Freq: Once | CUTANEOUS | Status: AC
  Administered 2024-02-17: 2 via TOPICAL

## 2024-02-17 MED ORDER — DEXMEDETOMIDINE HCL IN NACL 80 MCG/20ML IV SOLN
INTRAVENOUS | Status: DC | PRN
  Administered 2024-02-17: 4 ug via INTRAVENOUS
  Administered 2024-02-17: 12 ug via INTRAVENOUS
  Administered 2024-02-17: 8 ug via INTRAVENOUS

## 2024-02-17 MED ORDER — PHENYLEPHRINE 80 MCG/ML (10ML) SYRINGE FOR IV PUSH (FOR BLOOD PRESSURE SUPPORT)
PREFILLED_SYRINGE | INTRAVENOUS | Status: AC
  Filled 2024-02-17: qty 10

## 2024-02-17 MED ORDER — FENTANYL CITRATE (PF) 250 MCG/5ML IJ SOLN
INTRAMUSCULAR | Status: DC | PRN
  Administered 2024-02-17 (×2): 50 ug via INTRAVENOUS
  Administered 2024-02-17: 25 ug via INTRAVENOUS
  Administered 2024-02-17: 50 ug via INTRAVENOUS
  Administered 2024-02-17: 25 ug via INTRAVENOUS

## 2024-02-17 NOTE — Anesthesia Procedure Notes (Signed)
 Procedure Name: Intubation Date/Time: 02/17/2024 7:52 AM  Performed by: Kearney Rosina SAILOR, RNPre-anesthesia Checklist: Patient identified, Emergency Drugs available, Suction available and Patient being monitored Patient Re-evaluated:Patient Re-evaluated prior to induction Oxygen Delivery Method: Circle system utilized Preoxygenation: Pre-oxygenation with 100% oxygen Induction Type: IV induction Ventilation: Mask ventilation without difficulty Laryngoscope Size: Mac and 3 Grade View: Grade I Tube type: Oral Tube size: 7.0 mm Number of attempts: 1 Airway Equipment and Method: Stylet and Oral airway Placement Confirmation: ETT inserted through vocal cords under direct vision, positive ETCO2 and breath sounds checked- equal and bilateral Secured at: 23 cm Tube secured with: Tape Dental Injury: Teeth and Oropharynx as per pre-operative assessment  Comments: Atraumatic placement

## 2024-02-17 NOTE — Transfer of Care (Signed)
 Immediate Anesthesia Transfer of Care Note  Patient: Mckenzie Maldonado  Procedure(s) Performed: HYSTERECTOMY, TOTAL, LAPAROSCOPIC, ROBOT-ASSISTED WITH BILATERAL SALPINGECTOMY (Bilateral: Pelvis) CYSTOSCOPY (Bladder) REPAIR, BLADDER (Bladder)  Patient Location: PACU  Anesthesia Type:General  Level of Consciousness: awake, alert , and oriented  Airway & Oxygen Therapy: Patient Spontanous Breathing and Patient connected to face mask oxygen  Post-op Assessment: Report given to RN and Post -op Vital signs reviewed and stable  Post vital signs: Reviewed and stable  Last Vitals:  Vitals Value Taken Time  BP 119/70 02/17/24 11:52  Temp    Pulse 83 02/17/24 11:56  Resp 18 02/17/24 11:56  SpO2 92 % 02/17/24 11:56  Vitals shown include unfiled device data.  Last Pain:  Vitals:   02/17/24 0618  TempSrc: Oral  PainSc: 0-No pain      Patients Stated Pain Goal: 3 (02/17/24 9381)  Complications: There were no known notable events for this encounter.

## 2024-02-17 NOTE — Anesthesia Preprocedure Evaluation (Addendum)
 "                                  Anesthesia Evaluation  Patient identified by MRN, date of birth, ID band Patient awake    Reviewed: Allergy & Precautions, NPO status , Patient's Chart, lab work & pertinent test results  History of Anesthesia Complications Negative for: history of anesthetic complications  Airway Mallampati: II  TM Distance: >3 FB Neck ROM: Full    Dental  (+) Teeth Intact, Dental Advisory Given   Pulmonary neg shortness of breath, neg sleep apnea, neg COPD, neg recent URI   breath sounds clear to auscultation       Cardiovascular negative cardio ROS  Rhythm:Regular     Neuro/Psych  PSYCHIATRIC DISORDERS Anxiety Depression    negative neurological ROS     GI/Hepatic negative GI ROS, Neg liver ROS,,,  Endo/Other  negative endocrine ROS    Renal/GU negative Renal ROS     Musculoskeletal negative musculoskeletal ROS (+)    Abdominal   Peds  Hematology negative hematology ROS (+) Lab Results      Component                Value               Date                      WBC                      10.3                02/14/2024                HGB                      14.6                02/14/2024                HCT                      41.9                02/14/2024                MCV                      87.3                02/14/2024                PLT                      259                 02/14/2024              Anesthesia Other Findings   Reproductive/Obstetrics                              Anesthesia Physical Anesthesia Plan  ASA: 1  Anesthesia Plan: General   Post-op Pain Management: Tylenol  PO (pre-op)* and Toradol  IV (intra-op)*   Induction: Intravenous  PONV Risk Score and Plan: 4 or greater and Ondansetron , Dexamethasone , Midazolam   and Propofol  infusion  Airway Management Planned: Oral ETT  Additional Equipment: None  Intra-op Plan:   Post-operative Plan: Extubation in OR  Informed  Consent: I have reviewed the patients History and Physical, chart, labs and discussed the procedure including the risks, benefits and alternatives for the proposed anesthesia with the patient or authorized representative who has indicated his/her understanding and acceptance.     Dental advisory given  Plan Discussed with: CRNA  Anesthesia Plan Comments:         Anesthesia Quick Evaluation  "

## 2024-02-17 NOTE — Op Note (Addendum)
 02/17/2024  [id number] Mckenzie Maldonado       OPERATIVE REPORT   Preop Diagnosis: menorrhagia, dysmenorrhea, anemia, fibroids  Procedure: robotic hysterectomy, bilateral salpingectomy, cystotomy repair, cystoscopy   Surgeon: Dr. Almarie Sauer Deandra Goering Assistant: Circulator: Starla Duwaine BROCKS, RN Physician Assistant: Nicholaus Jorene BRAVO, PA-C Radiology Technologist: Pauley, Chad M, RT Relief Circulator: Bridgett Waddell PARAS, RN Relief Scrub: Bridgett Waddell PARAS, RN Scrub Person: Key, Jackolyn VIRGIN Blumenthal, Sonya R    Fluids: please see anesthesia report   Complications: None Anesthesia: General     Findings:  Dense broad 16cm uterus with multiple fibroids, normal ovaries and tubes. Small cystotomy noted 3mm on left dome of the bladder from time of taking down the bladder flap that was repaired.  Cystoscopy at the end of the case with normal bladder and patent ureters bilaterally.   Estimated blood loss: 50cc   Specimens: Uterus, fibroids, cervix and bilateral tubes   Disposition of specimen: Pathology          Patient is taken to the operating room. She is placed in the supine position. She is a running IV in place. Informed consent was present on the chart. SCDs on her lower extremities and functioning properly. Patient was positioned while she was awake.  Her legs were placed in the low lithotomy position in [place] stirrups. Her arms were tucked by the side.  General endotracheal anesthesia was administered by the anesthesia staff without difficulty.       Dura prep was then used to prep the abdomen and Hibiclens  was used to prep the inner thighs, perineum and vagina. Once 3 minutes had past the patient was draped in a normal standard fashion. A proper time out was performed and everyone agreed.  The legs were lifted to the high lithotomy position. A bivalve speculum was inserted into the vagina and the anterior lip of the cervix was grasped with single-tooth tenaculum.  The uterus sounded to  12 cm. Pratt dilators were used to dilate the cervix.  The RUMI uterine manipulator was obtained inserted into the endometrial cavity and the bulb of the disposable tip was inflated with 8 cc of normal saline. There was a good fit of the KOH ring around the cervix. The tenaculum and bivavle speculum was removed. There is also good manipulation of the uterus.  A Foley catheter was placed to straight drain.  Clear urine was noted. Legs were lowered to the low lithotomy position and attention was turned the abdomen.   Superior to the umbilicus, marcaine  0.25% used to anesthetize the skin.  Using #11 blade, 8mm skin incision was made.  The 8mm robotic trocar and sleeve was inserted under direct visualization.  CO2 gas was  started and patient was placed in trendelenburg position.  Two additional 8mm ports were placed under direct visualization in the left and right lower quadrant.     Ureters were identifies.  Attention was turned to the left side. The uterus was very dense and spanned across the lower pelvis. The left tube was elevated and the mesosalpinx was desiccated with the vessel sealer.  The left uterine ovarian pedicle was serially clamped cauterized and incised. Left round ligament was serially clamped cauterized and incised. The anterior and posterior peritoneum of the inferior leaf of the broad ligament were opened. The beginning of the bladder flap was created. It was noted at this time that the insufflation was off at this time and this may have contributed to the small entry into the bladder.  This was recognized and repaired. Urology called and evaluated and agreed with repair and method of repair. The cystotomy was repaired with 2.0 vicryl in a running stitch. An imbricating stitch was placed in a running fashion with 3.0 vicryl. Two interrupted 3.0 stitches were placed as well.  Back flow of the bladder was done after the repair and no leak was seen.  The bladder was taken down below the level of  the KOH ring further before the repair as well. The left uterine artery skeletonized and then just superior to the KOH ring this vessel was serially clamped, cauterized, and incised.   Attention was turned the right side.  The uterus was placed on stretch to the opposite side.    The mesosalpinx was incised freeing the tube. Then the right uterine ovarian pedicle was serially clamped cauterized and incised. Next the right round ligament was serially clamped cauterized and incised. The anterior posterior peritoneum of the inferiorly for the broad ligament were opened. The anterior peritoneum was carried across to the dissection on the left side. The remainder of the bladder flap was created using sharp dissection. The bladder was well below the level of the KOH ring. The right uterine artery skeletonized. Then the right uterine artery, above the level of the KOH ring, was serially clamped cauterized and incised. The uterus was devascularized at this point.   The colpotomy was performed.  This was carried around a circumferential fashion until the vaginal mucosa was completely incised in the specimen was freed.  The specimen was then delivered to the vagina. Three fibroids were removed and delivered through the vagina, to allow for delivery through the vagina.  A vaginal occlusive device was used to maintain the pneumoperitoneum   Instruments were changed with a needle driver and prograsp.  Using a 9 inch  zero V-lock suture, the cuff was closed by incorporating the anterior and posterior vaginal mucosa in each stitch. This was carried across all the way to the left corner and a running fashion. Two stitches were brought back towards the midline and the suture was cut flush with the vagina. The needle was brought out the pelvis. The pelvis was irrigated. All pedicles were inspected. No bleeding was noted.   Co2 pressures were lowered to 8mm Hg.  Again, no bleeding was noted.  Ureters were noted deep in the  pelvis to be peristalsing.  At this point the procedure was completed.  The remaining instruments were removed.  The ports were removed under direct visualization of the laparoscope and the pneumoperitoneum was relieved.   The skin was then closed with subcuticular stitches of 3-0 Vicryl. The skin was cleansed Dermabond was applied. Attention was then turned the vagina and the cuff was inspected. No bleeding was noted.  The Foley catheter was removed.  Cystoscopy was performed.    Ureters were noted with normal urine jets from each one was seen.  Foley was left in for a week to allow for proper bladder healing.  Sponge, lap, needle, instrument counts were correct x2. Patient tolerated the procedure very well. She was awakened from anesthesia, extubated and taken to recovery in stable condition.      Dr. Glennon

## 2024-02-17 NOTE — Interval H&P Note (Signed)
 History and Physical Interval Note:  02/17/2024 7:07 AM  Mckenzie Maldonado  has presented today for surgery, with the diagnosis of fibroids, dysmenorrhea, pelvic pain.  The various methods of treatment have been discussed with the patient and family. After consideration of risks, benefits and other options for treatment, the patient has consented to  Procedures with comments: HYSTERECTOMY, TOTAL, LAPAROSCOPIC, ROBOT-ASSISTED WITH SALPINGECTOMY (Bilateral) - possible olympus bag with lina morcellator CYSTOSCOPY (N/A) as a surgical intervention.  The patient's history has been reviewed, patient examined, no change in status, stable for surgery.  I have reviewed the patient's chart and labs.  Questions were answered to the patient's satisfaction.     Almarie MARLA Carpen

## 2024-03-01 ENCOUNTER — Encounter: Admitting: Obstetrics and Gynecology

## 2024-03-28 ENCOUNTER — Encounter: Admitting: Obstetrics and Gynecology

## 2024-04-25 ENCOUNTER — Encounter: Admitting: Obstetrics and Gynecology
